# Patient Record
Sex: Female | Born: 1960 | Race: White | Hispanic: No | Marital: Married | Smoking: Never smoker
Health system: Southern US, Community
[De-identification: ages and names within clinical notes are randomized; demographics above are authoritative.]

## PROBLEM LIST (undated history)

## (undated) DIAGNOSIS — Z9889 Other specified postprocedural states: Secondary | ICD-10-CM

## (undated) DIAGNOSIS — T7840XA Allergy, unspecified, initial encounter: Secondary | ICD-10-CM

## (undated) DIAGNOSIS — F32A Depression, unspecified: Secondary | ICD-10-CM

## (undated) DIAGNOSIS — M1990 Unspecified osteoarthritis, unspecified site: Secondary | ICD-10-CM

## (undated) DIAGNOSIS — K219 Gastro-esophageal reflux disease without esophagitis: Secondary | ICD-10-CM

## (undated) DIAGNOSIS — Z8489 Family history of other specified conditions: Secondary | ICD-10-CM

## (undated) DIAGNOSIS — F419 Anxiety disorder, unspecified: Secondary | ICD-10-CM

## (undated) DIAGNOSIS — E7800 Pure hypercholesterolemia, unspecified: Secondary | ICD-10-CM

## (undated) HISTORY — PX: OOPHORECTOMY: SHX86

## (undated) HISTORY — PX: COLONOSCOPY: SHX174

## (undated) HISTORY — PX: CYSTECTOMY: SUR359

## (undated) HISTORY — PX: ESOPHAGOGASTRODUODENOSCOPY (EGD) WITH PROPOFOL: SHX5813

---

## 1999-04-28 ENCOUNTER — Encounter: Payer: Self-pay | Admitting: Family Medicine

## 1999-04-28 ENCOUNTER — Encounter: Admission: RE | Admit: 1999-04-28 | Discharge: 1999-04-28 | Payer: Self-pay | Admitting: Family Medicine

## 2000-06-09 ENCOUNTER — Encounter: Admission: RE | Admit: 2000-06-09 | Discharge: 2000-06-09 | Payer: Self-pay | Admitting: Family Medicine

## 2000-06-09 ENCOUNTER — Encounter: Payer: Self-pay | Admitting: Family Medicine

## 2001-06-22 ENCOUNTER — Encounter: Payer: Self-pay | Admitting: Family Medicine

## 2001-06-22 ENCOUNTER — Encounter: Admission: RE | Admit: 2001-06-22 | Discharge: 2001-06-22 | Payer: Self-pay | Admitting: Family Medicine

## 2001-07-15 ENCOUNTER — Encounter: Payer: Self-pay | Admitting: Family Medicine

## 2001-07-15 ENCOUNTER — Encounter: Admission: RE | Admit: 2001-07-15 | Discharge: 2001-07-15 | Payer: Self-pay | Admitting: Family Medicine

## 2001-11-14 ENCOUNTER — Encounter (INDEPENDENT_AMBULATORY_CARE_PROVIDER_SITE_OTHER): Payer: Self-pay | Admitting: Specialist

## 2001-11-14 ENCOUNTER — Ambulatory Visit (HOSPITAL_COMMUNITY): Admission: RE | Admit: 2001-11-14 | Discharge: 2001-11-14 | Payer: Self-pay | Admitting: Obstetrics and Gynecology

## 2002-07-06 ENCOUNTER — Other Ambulatory Visit: Admission: RE | Admit: 2002-07-06 | Discharge: 2002-07-06 | Payer: Self-pay | Admitting: Obstetrics and Gynecology

## 2002-07-18 ENCOUNTER — Encounter: Payer: Self-pay | Admitting: Obstetrics and Gynecology

## 2002-07-18 ENCOUNTER — Ambulatory Visit (HOSPITAL_COMMUNITY): Admission: RE | Admit: 2002-07-18 | Discharge: 2002-07-18 | Payer: Self-pay | Admitting: Obstetrics and Gynecology

## 2003-07-11 ENCOUNTER — Other Ambulatory Visit: Admission: RE | Admit: 2003-07-11 | Discharge: 2003-07-11 | Payer: Self-pay | Admitting: Obstetrics and Gynecology

## 2003-07-25 ENCOUNTER — Ambulatory Visit (HOSPITAL_COMMUNITY): Admission: RE | Admit: 2003-07-25 | Discharge: 2003-07-25 | Payer: Self-pay | Admitting: Obstetrics and Gynecology

## 2004-08-25 ENCOUNTER — Other Ambulatory Visit: Admission: RE | Admit: 2004-08-25 | Discharge: 2004-08-25 | Payer: Self-pay | Admitting: Obstetrics and Gynecology

## 2004-09-09 ENCOUNTER — Ambulatory Visit (HOSPITAL_COMMUNITY): Admission: RE | Admit: 2004-09-09 | Discharge: 2004-09-09 | Payer: Self-pay | Admitting: Obstetrics and Gynecology

## 2005-09-11 ENCOUNTER — Other Ambulatory Visit: Admission: RE | Admit: 2005-09-11 | Discharge: 2005-09-11 | Payer: Self-pay | Admitting: Family Medicine

## 2005-09-11 ENCOUNTER — Ambulatory Visit: Payer: Self-pay | Admitting: Family Medicine

## 2005-09-11 ENCOUNTER — Encounter: Payer: Self-pay | Admitting: Family Medicine

## 2005-09-16 ENCOUNTER — Ambulatory Visit (HOSPITAL_COMMUNITY): Admission: RE | Admit: 2005-09-16 | Discharge: 2005-09-16 | Payer: Self-pay | Admitting: Family Medicine

## 2006-04-05 ENCOUNTER — Ambulatory Visit: Payer: Self-pay | Admitting: Family Medicine

## 2006-04-07 ENCOUNTER — Encounter: Payer: Self-pay | Admitting: Family Medicine

## 2006-04-07 ENCOUNTER — Telehealth (INDEPENDENT_AMBULATORY_CARE_PROVIDER_SITE_OTHER): Payer: Self-pay | Admitting: *Deleted

## 2006-04-15 ENCOUNTER — Ambulatory Visit: Payer: Self-pay | Admitting: Family Medicine

## 2006-04-15 DIAGNOSIS — J157 Pneumonia due to Mycoplasma pneumoniae: Secondary | ICD-10-CM

## 2006-05-13 ENCOUNTER — Ambulatory Visit: Payer: Self-pay | Admitting: Family Medicine

## 2006-05-13 DIAGNOSIS — J329 Chronic sinusitis, unspecified: Secondary | ICD-10-CM | POA: Insufficient documentation

## 2006-05-14 ENCOUNTER — Encounter: Payer: Self-pay | Admitting: Family Medicine

## 2006-05-17 LAB — CONVERTED CEMR LAB
Basophils Relative: 1 % (ref 0–1)
HCT: 44.4 % (ref 36.0–46.0)
Lymphocytes Relative: 33 % (ref 12–46)
Platelets: 296 10*3/uL (ref 150–400)
RDW: 13 % (ref 11.5–14.0)
WBC: 5 10*3/uL (ref 4.0–10.5)

## 2006-05-18 ENCOUNTER — Encounter: Payer: Self-pay | Admitting: Family Medicine

## 2006-10-05 ENCOUNTER — Encounter: Payer: Self-pay | Admitting: Family Medicine

## 2006-10-05 ENCOUNTER — Encounter: Admission: RE | Admit: 2006-10-05 | Discharge: 2006-10-05 | Payer: Self-pay | Admitting: Family Medicine

## 2006-10-05 ENCOUNTER — Other Ambulatory Visit: Admission: RE | Admit: 2006-10-05 | Discharge: 2006-10-05 | Payer: Self-pay | Admitting: Family Medicine

## 2006-10-05 ENCOUNTER — Ambulatory Visit: Payer: Self-pay | Admitting: Family Medicine

## 2006-10-05 DIAGNOSIS — E781 Pure hyperglyceridemia: Secondary | ICD-10-CM | POA: Insufficient documentation

## 2006-10-05 DIAGNOSIS — F411 Generalized anxiety disorder: Secondary | ICD-10-CM | POA: Insufficient documentation

## 2006-10-05 DIAGNOSIS — N951 Menopausal and female climacteric states: Secondary | ICD-10-CM

## 2006-10-06 ENCOUNTER — Telehealth (INDEPENDENT_AMBULATORY_CARE_PROVIDER_SITE_OTHER): Payer: Self-pay | Admitting: *Deleted

## 2006-10-12 ENCOUNTER — Telehealth (INDEPENDENT_AMBULATORY_CARE_PROVIDER_SITE_OTHER): Payer: Self-pay | Admitting: *Deleted

## 2006-10-12 ENCOUNTER — Encounter: Admission: RE | Admit: 2006-10-12 | Discharge: 2006-10-12 | Payer: Self-pay | Admitting: Family Medicine

## 2006-10-12 ENCOUNTER — Encounter: Payer: Self-pay | Admitting: Family Medicine

## 2007-02-22 ENCOUNTER — Telehealth: Payer: Self-pay | Admitting: Family Medicine

## 2007-08-08 ENCOUNTER — Telehealth: Payer: Self-pay | Admitting: Family Medicine

## 2008-01-20 ENCOUNTER — Encounter: Admission: RE | Admit: 2008-01-20 | Discharge: 2008-01-20 | Payer: Self-pay | Admitting: Family Medicine

## 2008-04-19 ENCOUNTER — Other Ambulatory Visit: Admission: RE | Admit: 2008-04-19 | Discharge: 2008-04-19 | Payer: Self-pay | Admitting: Family Medicine

## 2009-01-28 ENCOUNTER — Encounter: Admission: RE | Admit: 2009-01-28 | Discharge: 2009-01-28 | Payer: Self-pay | Admitting: Family Medicine

## 2010-02-25 ENCOUNTER — Ambulatory Visit (HOSPITAL_COMMUNITY)
Admission: RE | Admit: 2010-02-25 | Discharge: 2010-02-25 | Payer: Self-pay | Source: Home / Self Care | Admitting: Family Medicine

## 2010-04-27 ENCOUNTER — Encounter: Payer: Self-pay | Admitting: Family Medicine

## 2010-08-22 NOTE — Op Note (Signed)
NAME:  Alexandria Middleton, HOFSTRA A                           ACCOUNT NO.:  [id number]   MEDICAL RECORD NO.:  [id number]                   PATIENT TYPE:  AMB   LOCATION:  SDC                                  FACILITY:  [facility]   PHYSICIAN:  Cynthia P. Romine, M.D.             DATE OF BIRTH:  1960-11-02   DATE OF PROCEDURE:  11/14/2001  DATE OF DISCHARGE:                                 OPERATIVE REPORT   PREOPERATIVE DIAGNOSIS:  Persistent left ovarian cyst.   POSTOPERATIVE DIAGNOSIS:  Probable endometrioma, path pending.   PROCEDURE:  Laparoscopic left salpingo-oophorectomy.   SURGEON:  Cynthia P. Romine, M.D.   ASSISTANT:  Laqueta Linden, M.D.   ANESTHESIA:  General endotracheal.   ESTIMATED BLOOD LOSS:  Minimal.   COMPLICATIONS:  None.   PROCEDURE:  The patient was taken to the operating room and, after the  induction of adequate general endotracheal anesthesia, was placed in the  dorsal lithotomy position and prepped and draped in the usual fashion.  An  acorn uterine manipulator and the bladder was drained with a Foley.  A  subumbilical incision was made and the Veress needle was inserted into the  peritoneal space.  Proper placement was tested by noting free flow of saline  through the Veress needle with a negative aspirate and then by noting the  response of a drop of saline placed at the hub of the Veress needle to  negative pressure as the abdominal wall was elevated.  Pneumoperitoneum was  created with 3 liters of CO2.  A disposable 10 mm trocar was then inserted  into the peritoneal space and proper placement noted with the laparoscope.  Two 5 mm trocars were inserted suprapubically on the right and left under  direct visualization.  The pelvis was inspected.  The uterus was of normal  size, shape, and contour.  The right ovary was freely mobile and had an  ovulatory site present, but no endometriosis noted.  On the left side, the  ovary was adherent to the posterior cul-de-sac and  the posterior leaf of the  broad ligament.  Upon trying to free up the ovary, it exuded a moderate  amount of chocolate fluid consistent with an endometrioma.  The ovary was  dissected off the peritoneum of the cul-de-sac and the posterior leaf of the  broad ligament.  Tripolar cautery was used to separate the tube and utero-  ovarian ligament off the uterus.  When the ovary was freed up in this  fashion, two 0 chromic Endoloops were used to tie off the infundibulopelvic  ligament and the specimen was freed up with scissors.  The specimen was then  morcellated and removed through the abdominal trocar sleeve.  The pelvis was  irrigated.  There was a scant amount of bleeding on the raw peritoneal  surfaces where the dissection had been carried out and this was controlled  with cautery  using the tripolar.  The appendix was visualized and was  normal.  The upper abdomen was inspected and also felt to be normal.  Photographic documentation was taken and the procedure was terminated.  The  instruments were removed from the abdomen, pneumoperitoneum was allowed to  escape, incisions were closed subcuticularly  with 3-0 Vicryl Rapide and they were injected with 0.5% Marcaine with  epinephrine.  Steri-Strips were applied.  The instruments were removed from  the vagina and the procedure was terminated.  The patient tolerated it well  and went in satisfactory condition to postanesthesia recovery.                                               Cynthia P. Romine, M.D.    CPR/MEDQ  D:  11/14/2001  T:  11/16/2001  Job:  [id number]

## 2011-02-05 ENCOUNTER — Other Ambulatory Visit (HOSPITAL_COMMUNITY): Payer: Self-pay | Admitting: Family Medicine

## 2011-02-05 DIAGNOSIS — Z1231 Encounter for screening mammogram for malignant neoplasm of breast: Secondary | ICD-10-CM

## 2011-03-04 ENCOUNTER — Ambulatory Visit (HOSPITAL_COMMUNITY)
Admission: RE | Admit: 2011-03-04 | Discharge: 2011-03-04 | Disposition: A | Discharge status: Home / Self Care | Payer: BC Managed Care – PPO | Source: Ambulatory Visit | Attending: Family Medicine | Admitting: Family Medicine

## 2011-03-04 DIAGNOSIS — Z1231 Encounter for screening mammogram for malignant neoplasm of breast: Secondary | ICD-10-CM | POA: Insufficient documentation

## 2012-01-28 ENCOUNTER — Other Ambulatory Visit (HOSPITAL_COMMUNITY): Payer: Self-pay | Admitting: Family Medicine

## 2012-01-28 DIAGNOSIS — Z1231 Encounter for screening mammogram for malignant neoplasm of breast: Secondary | ICD-10-CM

## 2012-03-07 ENCOUNTER — Ambulatory Visit (HOSPITAL_COMMUNITY)
Admission: RE | Admit: 2012-03-07 | Discharge: 2012-03-07 | Disposition: A | Discharge status: Home / Self Care | Payer: PRIVATE HEALTH INSURANCE | Source: Ambulatory Visit | Attending: Family Medicine | Admitting: Family Medicine

## 2012-03-07 DIAGNOSIS — Z1231 Encounter for screening mammogram for malignant neoplasm of breast: Secondary | ICD-10-CM | POA: Insufficient documentation

## 2012-07-05 ENCOUNTER — Other Ambulatory Visit: Payer: Self-pay | Admitting: Family Medicine

## 2012-07-05 ENCOUNTER — Other Ambulatory Visit (HOSPITAL_COMMUNITY)
Admission: RE | Admit: 2012-07-05 | Discharge: 2012-07-05 | Disposition: A | Discharge status: Home / Self Care | Payer: BC Managed Care – PPO | Source: Ambulatory Visit | Attending: Family Medicine | Admitting: Family Medicine

## 2012-07-05 DIAGNOSIS — Z0000 Encounter for general adult medical examination without abnormal findings: Secondary | ICD-10-CM | POA: Insufficient documentation

## 2019-06-23 ENCOUNTER — Other Ambulatory Visit: Payer: Self-pay | Admitting: Obstetrics and Gynecology

## 2019-06-23 DIAGNOSIS — Z1231 Encounter for screening mammogram for malignant neoplasm of breast: Secondary | ICD-10-CM

## 2019-06-30 ENCOUNTER — Ambulatory Visit: Payer: PRIVATE HEALTH INSURANCE | Attending: Internal Medicine

## 2019-06-30 DIAGNOSIS — Z23 Encounter for immunization: Secondary | ICD-10-CM

## 2019-06-30 NOTE — Progress Notes (Signed)
   Covid-19 Vaccination Clinic  Name:  VASTI YAGI    MRN: [id number] DOB: [birth year]  06/30/2019  Ms. Simonet was observed post Covid-19 immunization for 15 minutes without incident. She was provided with Vaccine Information Sheet and instruction to access the V-Safe system.   Ms. Wehling was instructed to call 911 with any severe reactions post vaccine: Marland Kitchen Difficulty breathing  . Swelling of face and throat  . A fast heartbeat  . A bad rash all over body  . Dizziness and weakness   Immunizations Administered    Name Date Dose VIS Date Route   Pfizer COVID-19 Vaccine 06/30/2019  9:11 AM 0.3 mL 03/17/2019 Intramuscular   Manufacturer: [employer], [employer]   Lot: [id number]   NDC: [address]-7

## 2019-07-18 ENCOUNTER — Ambulatory Visit: Payer: PRIVATE HEALTH INSURANCE

## 2019-07-25 ENCOUNTER — Ambulatory Visit: Payer: PRIVATE HEALTH INSURANCE | Attending: Internal Medicine

## 2019-07-25 DIAGNOSIS — Z23 Encounter for immunization: Secondary | ICD-10-CM

## 2019-07-25 NOTE — Progress Notes (Signed)
   Covid-19 Vaccination Clinic  Name:  Alexandria Middleton    MRN: [id number] DOB: 06-17-1960  07/25/2019  [name] was observed post Covid-19 immunization for 15 minutes without incident. She was provided with Vaccine Information Sheet and instruction to access the V-Safe system.   [name] was instructed to call 911 with any severe reactions post vaccine: Marland Kitchen Difficulty breathing  . Swelling of face and throat  . A fast heartbeat  . A bad rash all over body  . Dizziness and weakness   Immunizations Administered    Name Date Dose VIS Date Route   Pfizer COVID-19 Vaccine 07/25/2019  8:39 AM 0.3 mL 05/31/2018 Intramuscular   Manufacturer: [employer], [employer]   Lot: [id number]   NDC: [address]-9

## 2019-09-06 ENCOUNTER — Ambulatory Visit
Admission: RE | Admit: 2019-09-06 | Discharge: 2019-09-06 | Disposition: A | Discharge status: Home / Self Care | Payer: 59 | Source: Ambulatory Visit | Attending: Obstetrics and Gynecology | Admitting: Obstetrics and Gynecology

## 2019-09-06 ENCOUNTER — Other Ambulatory Visit: Payer: Self-pay

## 2019-09-06 DIAGNOSIS — Z1231 Encounter for screening mammogram for malignant neoplasm of breast: Secondary | ICD-10-CM

## 2020-10-23 ENCOUNTER — Other Ambulatory Visit: Payer: Self-pay | Admitting: Obstetrics and Gynecology

## 2021-04-01 ENCOUNTER — Other Ambulatory Visit: Payer: Self-pay | Admitting: Internal Medicine

## 2021-04-01 DIAGNOSIS — Z1231 Encounter for screening mammogram for malignant neoplasm of breast: Secondary | ICD-10-CM

## 2021-05-09 ENCOUNTER — Ambulatory Visit (INDEPENDENT_AMBULATORY_CARE_PROVIDER_SITE_OTHER): Payer: 59

## 2021-05-09 ENCOUNTER — Encounter (HOSPITAL_COMMUNITY): Payer: Self-pay

## 2021-05-09 ENCOUNTER — Other Ambulatory Visit: Payer: Self-pay

## 2021-05-09 ENCOUNTER — Ambulatory Visit (HOSPITAL_COMMUNITY)
Admission: EM | Admit: 2021-05-09 | Discharge: 2021-05-09 | Disposition: A | Discharge status: Home / Self Care | Payer: 59 | Attending: Family Medicine | Admitting: Family Medicine

## 2021-05-09 DIAGNOSIS — R052 Subacute cough: Secondary | ICD-10-CM

## 2021-05-09 DIAGNOSIS — R059 Cough, unspecified: Secondary | ICD-10-CM

## 2021-05-09 DIAGNOSIS — J209 Acute bronchitis, unspecified: Secondary | ICD-10-CM | POA: Diagnosis not present

## 2021-05-09 MED ORDER — PREDNISONE 20 MG PO TABS: 40.0000 mg | ORAL_TABLET | Freq: Every day | ORAL | 0 refills | Status: AC

## 2021-05-09 MED ORDER — AZITHROMYCIN 250 MG PO TABS: 250.0000 mg | ORAL_TABLET | Freq: Every day | ORAL | 0 refills | Status: DC

## 2021-05-09 NOTE — Discharge Instructions (Signed)
You were seen today for upper respiratory symptoms.  You chest xray did not show any signs of pneumonia.  However, given your symptoms and your exam I have sent out a 5 day antibiotic for you today, along with 3 days of oral steroids.  You may take your other medications with this a well for relief.  If you continue to worsen or feel poorly, then please return for re-evaluation.

## 2021-05-09 NOTE — ED Provider Notes (Signed)
[facility]    CSN: [id number] Arrival date & time: 05/09/21  [id number]      History   Chief Complaint Chief Complaint  Patient presents with   URI    HPI Alexandria Middleton is a 61 y.o. female.   Patient is here for uri symptoms.  For the last 4 days having a lot of congestion, drainage, low grade fever, lots of chest congestion.  Deep cough.  [facility], flu and covid negative.  Recommended she be seen for possible chest xray based on her exam.  She has been taking mucinex, tessalon, sudafed, tylenol.  Last night she could hear a "crunchy" sound in her chest.  No energy.  Decreased appetite, no n/v per se.  She does feel sob with activity.   History reviewed. No pertinent past medical history.  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      Home Medications    Prior to Admission medications   Medication Sig Start Date End Date Taking? Authorizing Provider  Cholecalciferol (VITAMIN D3) 10 MCG (400 UNIT) tablet Take 400 Units by mouth daily.   Yes [provider]  Sertraline HCl 200 MG CAPS Take by mouth.   Yes [provider]    Family History Family History  Family history unknown: Yes    Social History Social History   Tobacco Use   Smoking status: Never   Smokeless tobacco: Never     Allergies   Codeine   Review of Systems Review of Systems  Constitutional:  Positive for activity change and appetite change. Negative for chills and fever.  HENT:  Positive for congestion, rhinorrhea and sore throat. Negative for sinus pain.   Respiratory:  Positive for cough, shortness of breath and wheezing.    Cardiovascular: Negative.   Gastrointestinal: Negative.     Physical Exam Triage Vital Signs ED Triage Vitals  Enc Vitals Group     BP 05/09/21 1013 116/79     Pulse Rate 05/09/21 1013 85     Resp 05/09/21 1013 17     Temp 05/09/21 1013 98 F (36.7 C)     Temp Source 05/09/21 1013 Oral     SpO2 05/09/21 1013 95 %     Weight --      Height --      Head Circumference --      Peak Flow --      Pain Score 05/09/21 1019 2     Pain Loc --      Pain Edu? --      Excl. in [place]? --    No data found.  Updated Vital Signs BP 116/79 (BP Location: Right Arm)    Pulse 85    Temp 98 F (36.7 C) (Oral)    Resp 17    SpO2 95%   Visual Acuity Right Eye Distance:   Left Eye Distance:   Bilateral Distance:    Right Eye Near:   Left Eye Near:    Bilateral Near:     Physical Exam Constitutional:      Appearance: Normal appearance.  HENT:     Head: Normocephalic  and atraumatic.     Right Ear: Tympanic membrane normal.     Left Ear: Tympanic membrane normal.     Nose: Congestion present.  Eyes:     Pupils: Pupils are equal, round, and reactive to light.  Cardiovascular:     Rate and Rhythm: Normal rate and regular rhythm.  Pulmonary:     Effort: Pulmonary effort is normal.     Breath sounds: Rhonchi present.  Musculoskeletal:     Cervical back: Normal range of motion and neck supple. No tenderness.  Neurological:     Mental Status: She is alert.     UC Treatments / Results  Labs (all labs ordered are listed, but only abnormal results are displayed) Labs Reviewed - No data to display  EKG   Radiology DG Chest 2 View  Result Date: 05/09/2021 CLINICAL DATA:  Cough EXAM: CHEST - 2 VIEW COMPARISON:  None. FINDINGS: Cardiac size is within normal limits. There are no signs of pulmonary edema or focal pulmonary consolidation. There is no pleural effusion or pneumothorax. There is large fixed hiatal hernia. IMPRESSION: There are no signs of pulmonary edema or focal pulmonary  consolidation. Large fixed hiatal hernia. Electronically Signed   By: Elmer Picker M.D.   On: 05/09/2021 10:49    Procedures Procedures (including critical care time)  Medications Ordered in UC Medications - No data to display  Initial Impression / Assessment and Plan / UC Course  I have reviewed the triage vital signs and the nursing notes.  Pertinent labs & imaging results that were available during my care of the patient were reviewed by me and considered in my medical decision making (see chart for details).    Patient seen today for uri symptoms.  She has a significant cough, worsening.  Xray was normal.  However, given her symtpoms and exam I have treated her with an antibiotic and prednisone.  She may continue otc meds as she has been taking them.  She should follow up if she is not improving as expected.   Final Clinical Impressions(s) / UC Diagnoses   Final diagnoses:  Acute bronchitis, unspecified organism  Subacute cough     Discharge Instructions      You were seen today for upper respiratory symptoms.  You chest xray did not show any signs of pneumonia.  However, given your symptoms and your exam I have sent out a 5 day antibiotic for you today, along with 3 days of oral steroids.  You may take your other medications with this a well for relief.  If you continue to worsen or feel poorly, then please return for re-evaluation.     ED Prescriptions     Medication Sig Dispense Auth. Provider   azithromycin (ZITHROMAX) 250 MG tablet Take 1 tablet (250 mg total) by mouth daily. Take first 2 tablets together, then 1 every day until finished. 6 tablet Laina Guerrieri, MD   predniSONE (DELTASONE) 20 MG tablet Take 2 tablets (40 mg total) by mouth daily for 3 days. 6 tablet Rondel Oh, MD      PDMP not reviewed this encounter.   Rondel Oh, MD 05/09/21 [phone]

## 2021-05-09 NOTE — ED Triage Notes (Signed)
Pt presents with chills, congestion, non productive cough that causes chest tightness, and fatigue X 3 days

## 2021-05-20 ENCOUNTER — Other Ambulatory Visit: Payer: Self-pay

## 2021-05-20 ENCOUNTER — Ambulatory Visit
Admission: RE | Admit: 2021-05-20 | Discharge: 2021-05-20 | Disposition: A | Discharge status: Home / Self Care | Payer: 59 | Source: Ambulatory Visit | Attending: Internal Medicine | Admitting: Internal Medicine

## 2021-05-20 DIAGNOSIS — Z1231 Encounter for screening mammogram for malignant neoplasm of breast: Secondary | ICD-10-CM

## 2022-04-17 ENCOUNTER — Ambulatory Visit
Admission: RE | Admit: 2022-04-17 | Discharge: 2022-04-17 | Disposition: A | Discharge status: Home / Self Care | Payer: 59 | Source: Ambulatory Visit | Attending: Internal Medicine | Admitting: Internal Medicine

## 2022-04-17 ENCOUNTER — Other Ambulatory Visit: Payer: Self-pay | Admitting: Internal Medicine

## 2022-04-17 DIAGNOSIS — M25561 Pain in right knee: Secondary | ICD-10-CM

## 2022-04-21 ENCOUNTER — Other Ambulatory Visit: Payer: Self-pay | Admitting: Internal Medicine

## 2022-04-21 ENCOUNTER — Encounter: Payer: Self-pay | Admitting: Internal Medicine

## 2022-04-21 DIAGNOSIS — Z1231 Encounter for screening mammogram for malignant neoplasm of breast: Secondary | ICD-10-CM

## 2022-04-22 ENCOUNTER — Other Ambulatory Visit: Payer: Self-pay | Admitting: Internal Medicine

## 2022-04-22 DIAGNOSIS — E785 Hyperlipidemia, unspecified: Secondary | ICD-10-CM

## 2022-05-22 ENCOUNTER — Ambulatory Visit
Admission: RE | Admit: 2022-05-22 | Discharge: 2022-05-22 | Disposition: A | Discharge status: Home / Self Care | Payer: No Typology Code available for payment source | Source: Ambulatory Visit | Attending: Internal Medicine | Admitting: Internal Medicine

## 2022-05-22 DIAGNOSIS — E785 Hyperlipidemia, unspecified: Secondary | ICD-10-CM

## 2022-06-09 ENCOUNTER — Ambulatory Visit
Admission: RE | Admit: 2022-06-09 | Discharge: 2022-06-09 | Disposition: A | Discharge status: Home / Self Care | Payer: 59 | Source: Ambulatory Visit | Attending: Internal Medicine | Admitting: Internal Medicine

## 2022-06-09 DIAGNOSIS — Z1231 Encounter for screening mammogram for malignant neoplasm of breast: Secondary | ICD-10-CM

## 2022-06-12 ENCOUNTER — Other Ambulatory Visit: Payer: Self-pay | Admitting: Internal Medicine

## 2022-06-12 DIAGNOSIS — R928 Other abnormal and inconclusive findings on diagnostic imaging of breast: Secondary | ICD-10-CM

## 2022-06-29 ENCOUNTER — Ambulatory Visit: Admission: RE | Admit: 2022-06-29 | Payer: 59 | Source: Ambulatory Visit

## 2022-06-29 ENCOUNTER — Other Ambulatory Visit: Payer: Self-pay | Admitting: Internal Medicine

## 2022-06-29 ENCOUNTER — Ambulatory Visit
Admission: RE | Admit: 2022-06-29 | Discharge: 2022-06-29 | Disposition: A | Discharge status: Home / Self Care | Payer: 59 | Source: Ambulatory Visit | Attending: Internal Medicine | Admitting: Internal Medicine

## 2022-06-29 DIAGNOSIS — R928 Other abnormal and inconclusive findings on diagnostic imaging of breast: Secondary | ICD-10-CM

## 2022-06-29 DIAGNOSIS — R921 Mammographic calcification found on diagnostic imaging of breast: Secondary | ICD-10-CM

## 2022-07-16 ENCOUNTER — Ambulatory Visit
Admission: RE | Admit: 2022-07-16 | Discharge: 2022-07-16 | Disposition: A | Discharge status: Home / Self Care | Payer: 59 | Source: Ambulatory Visit | Attending: Internal Medicine | Admitting: Internal Medicine

## 2022-07-16 DIAGNOSIS — R921 Mammographic calcification found on diagnostic imaging of breast: Secondary | ICD-10-CM

## 2022-07-16 DIAGNOSIS — R928 Other abnormal and inconclusive findings on diagnostic imaging of breast: Secondary | ICD-10-CM

## 2022-07-16 HISTORY — PX: BREAST BIOPSY: SHX20

## 2022-08-10 IMAGING — MG MM DIGITAL SCREENING BILAT W/ TOMO AND CAD
8 series · 8 of 24 positions shown · non-contrast
Comparison: Previous exam(s).

CLINICAL DATA: Screening.

EXAM:
DIGITAL SCREENING BILATERAL MAMMOGRAM WITH TOMOSYNTHESIS AND CAD
TECHNIQUE: Bilateral screening digital craniocaudal and mediolateral oblique
mammograms were obtained. Bilateral screening digital breast
tomosynthesis was performed. The images were evaluated with
computer-aided detection.

[L MLO synth-2D]
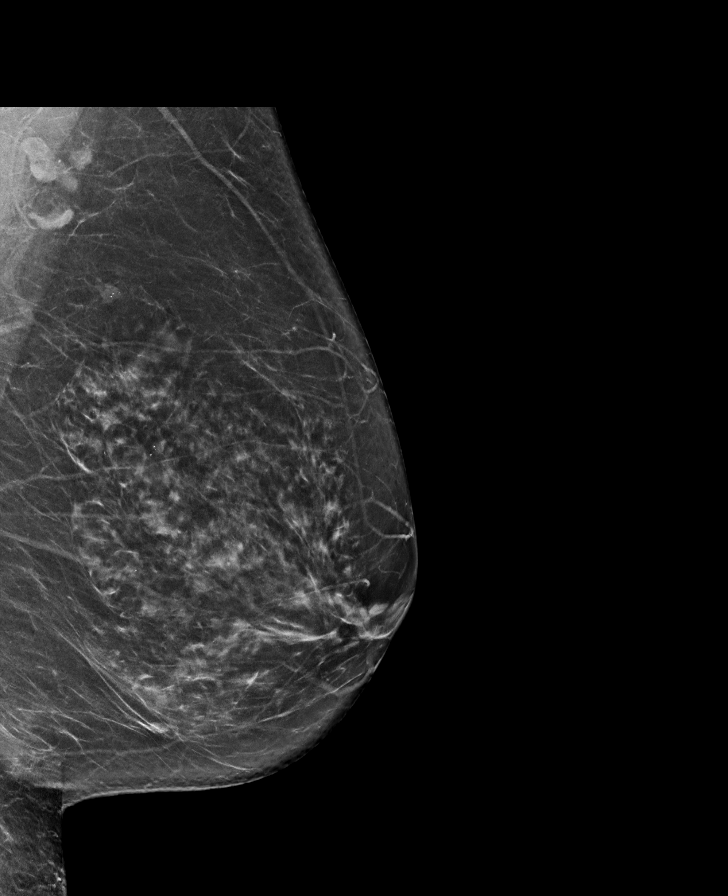

[R MLO synth-2D]
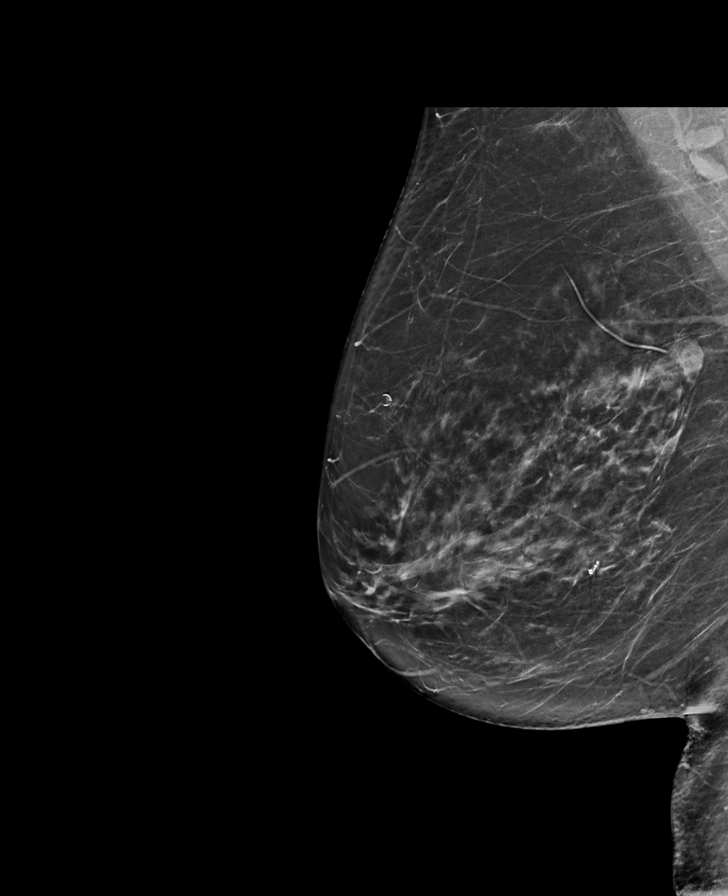

[R CC synth-2D]
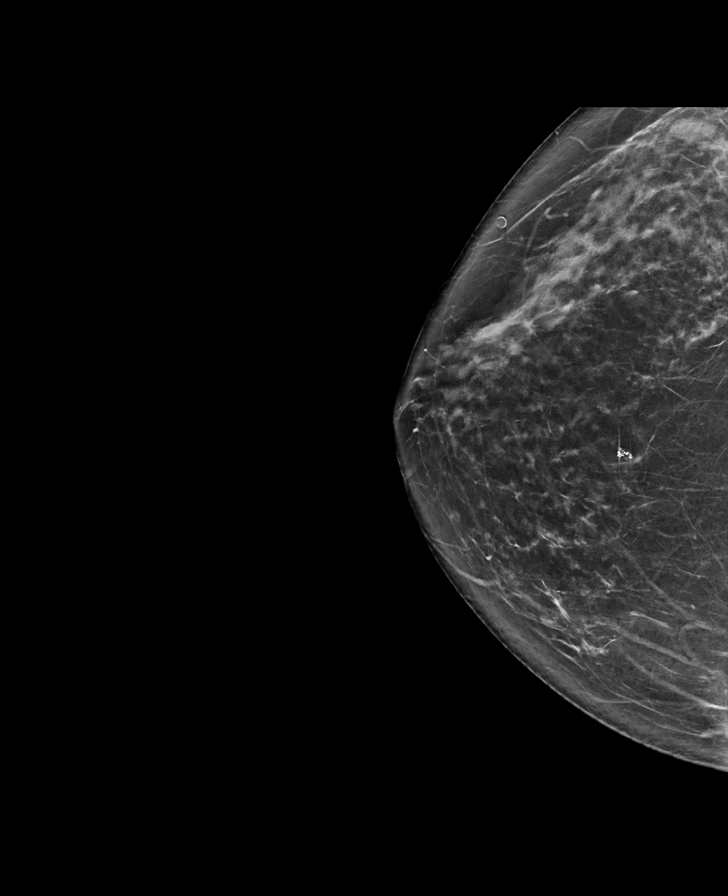

[L CC synth-2D]
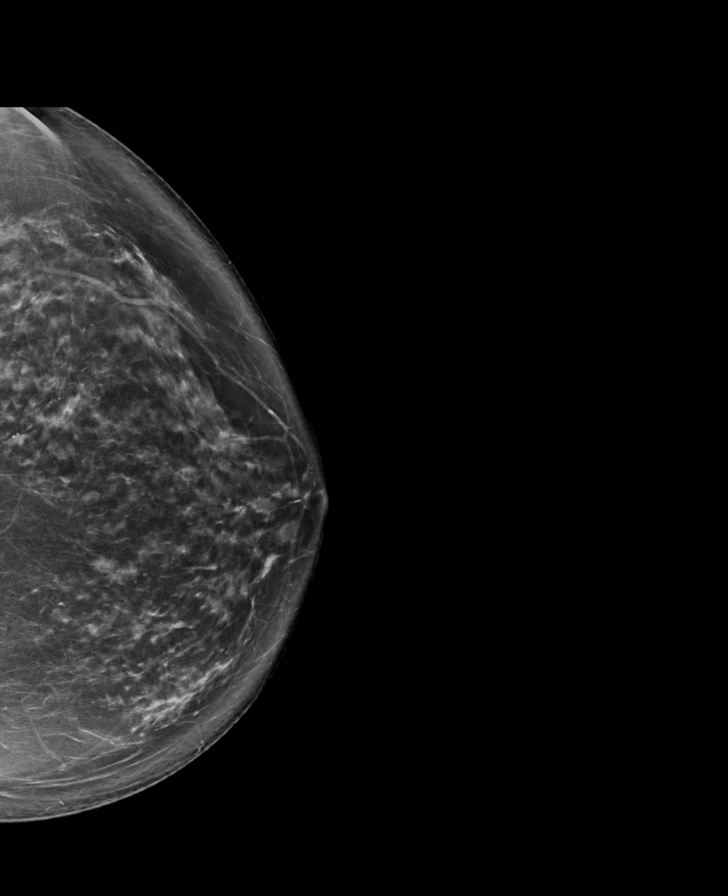

[L CC tomo · tomo slice 43/86.0]
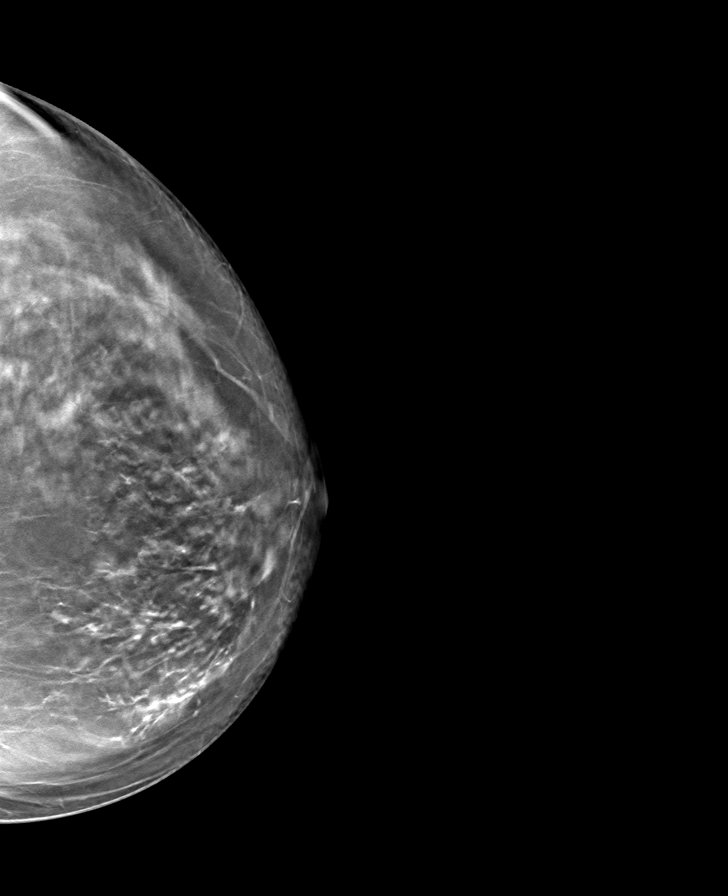

[R MLO tomo · tomo slice 41/80.0]
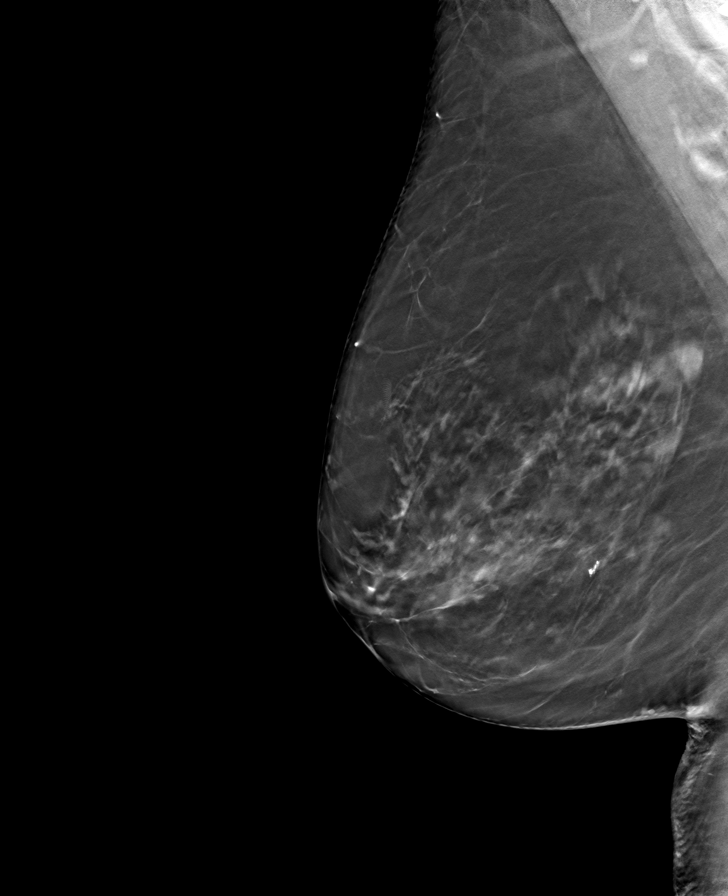

[L MLO tomo · tomo slice 43/84.0]
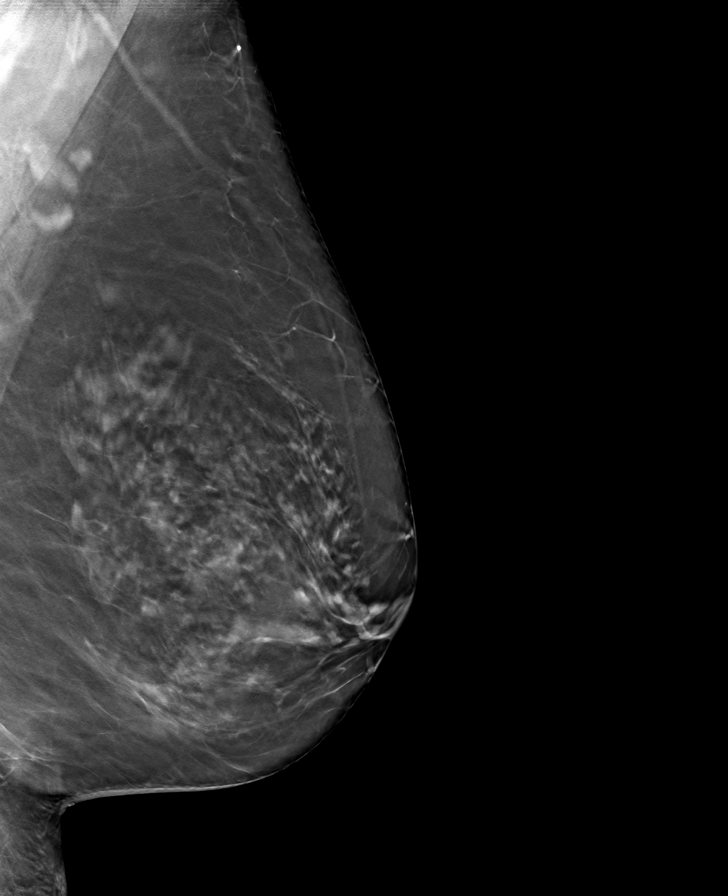

[R CC tomo · tomo slice 41/81.0]
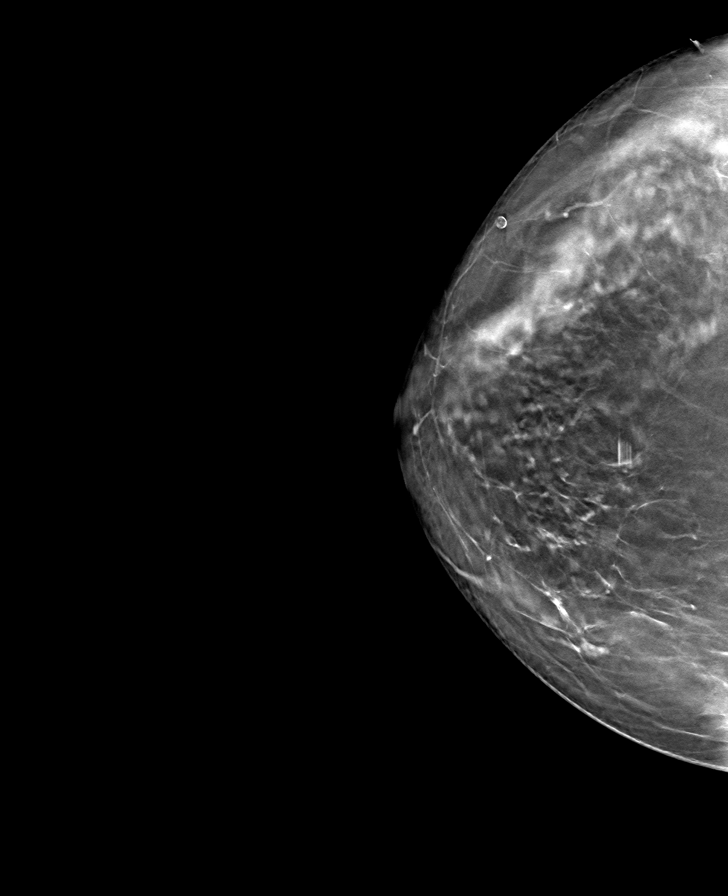

[8 of 24 positions shown; findings below may reference images not displayed]

ACR Breast Density Category c: The breast tissue is heterogeneously
dense, which may obscure small masses.
FINDINGS: There are no findings suspicious for malignancy.
IMPRESSION: No mammographic evidence of malignancy. A result letter of this
screening mammogram will be mailed directly to the patient.

RECOMMENDATION:
Screening mammogram in one year. (Code:[id number])

BI-RADS CATEGORY  1: Negative.

## 2023-01-18 ENCOUNTER — Other Ambulatory Visit: Payer: Self-pay | Admitting: Internal Medicine

## 2023-01-18 DIAGNOSIS — R921 Mammographic calcification found on diagnostic imaging of breast: Secondary | ICD-10-CM

## 2023-02-10 ENCOUNTER — Ambulatory Visit
Admission: RE | Admit: 2023-02-10 | Discharge: 2023-02-10 | Disposition: A | Discharge status: Home / Self Care | Payer: 59 | Source: Ambulatory Visit | Attending: Internal Medicine | Admitting: Internal Medicine

## 2023-02-10 ENCOUNTER — Ambulatory Visit: Payer: 59

## 2023-02-10 DIAGNOSIS — R921 Mammographic calcification found on diagnostic imaging of breast: Secondary | ICD-10-CM

## 2023-02-24 ENCOUNTER — Ambulatory Visit: Payer: Self-pay | Admitting: Orthopedic Surgery

## 2023-03-02 NOTE — Progress Notes (Signed)
COVID Vaccine Completed: yes  Date of COVID positive in last 90 days:  PCP - Hillard Danker, MD Cardiologist -   Chest x-ray -  EKG -  Stress Test -  ECHO -  Cardiac Cath -  Pacemaker/ICD device last checked: Spinal Cord Stimulator:  Bowel Prep -   Sleep Study -  CPAP -   Fasting Blood Sugar -  Checks Blood Sugar _____ times a day  Last dose of GLP1 agonist-  N/A GLP1 instructions:  Hold 7 days before surgery    Last dose of SGLT-2 inhibitors-  N/A SGLT-2 instructions:  Hold 3 days before surgery    Blood Thinner Instructions:  Time Aspirin Instructions: Last Dose:  Activity level:  Can go up a flight of stairs and perform activities of daily living without stopping and without symptoms of chest pain or shortness of breath.  Able to exercise without symptoms  Unable to go up a flight of stairs without symptoms of     Anesthesia review:   Patient denies shortness of breath, fever, cough and chest pain at PAT appointment  Patient verbalized understanding of instructions that were given to them at the PAT appointment. Patient was also instructed that they will need to review over the PAT instructions again at home before surgery.

## 2023-03-02 NOTE — Patient Instructions (Signed)
SURGICAL WAITING ROOM VISITATION  Patients having surgery or a procedure may have no more than 2 support people in the waiting area - these visitors may rotate.    Children under the age of 84 must have an adult with them who is not the patient.  Due to an increase in RSV and influenza rates and associated hospitalizations, children ages 33 and under may not visit patients in [facility] hospitals.  If the patient needs to stay at the hospital during part of their recovery, the visitor guidelines for inpatient rooms apply. Pre-op nurse will coordinate an appropriate time for 1 support person to accompany patient in pre-op.  This support person may not rotate.    Please refer to the [facility] website for the visitor guidelines for Inpatients (after your surgery is over and you are in a regular room).    Your procedure is scheduled on: 03/10/23   Report to [facility] Main Entrance    Report to admitting at 9:00 AM   Call this number if you have problems the morning of surgery [phone]   Do not eat food :After Midnight.   After Midnight you may have the following liquids until 8:30 AM DAY OF SURGERY  Water Non-Citrus Juices (without pulp, NO RED-Apple, White grape, White cranberry) Black Coffee (NO MILK/CREAM OR CREAMERS, sugar ok)  Clear Tea (NO MILK/CREAM OR CREAMERS, sugar ok) regular and decaf                             Plain Jell-O (NO RED)                                           Fruit ices (not with fruit pulp, NO RED)                                     Popsicles (NO RED)                                                               Sports drinks like Gatorade (NO RED)     The day of surgery:  Drink ONE (1) Pre-Surgery Clear Ensure at 8:30 AM the morning of surgery. Drink in one sitting. Do not sip.  This drink was given to you during your hospital  pre-op appointment visit. Nothing else to drink after completing the  Pre-Surgery Clear Ensure.           If you have questions, please contact your surgeon's office.   FOLLOW BOWEL PREP AND ANY ADDITIONAL PRE OP INSTRUCTIONS YOU RECEIVED FROM YOUR SURGEON'S OFFICE!!!     Oral Hygiene is also important to reduce your risk of infection.                                    Remember - BRUSH YOUR TEETH THE MORNING OF SURGERY WITH YOUR REGULAR TOOTHPASTE  DENTURES WILL BE REMOVED PRIOR TO SURGERY PLEASE DO NOT APPLY "Poly grip" OR  ADHESIVES!!!   Stop all vitamins and herbal supplements 7 days before surgery.   Take these medicines the morning of surgery with A SIP OF WATER: Tylenol, Omeprazole, Sertraline                               You may not have any metal on your body including hair pins, jewelry, and body piercing             Do not wear make-up, lotions, powders, perfumes, or deodorant  Do not wear nail polish including gel and S&S, artificial/acrylic nails, or any other type of covering on natural nails including finger and toenails. If you have artificial nails, gel coating, etc. that needs to be removed by a nail salon please have this removed prior to surgery or surgery may need to be canceled/ delayed if the surgeon/ anesthesia feels like they are unable to be safely monitored.   Do not shave  48 hours prior to surgery.    Do not bring valuables to the hospital. [facility] IS NOT             RESPONSIBLE   FOR VALUABLES.   Contacts, glasses, dentures or bridgework may not be worn into surgery.   Bring small overnight bag day of surgery.   DO NOT BRING YOUR HOME MEDICATIONS TO THE HOSPITAL. PHARMACY WILL DISPENSE MEDICATIONS LISTED ON YOUR MEDICATION LIST TO YOU DURING YOUR ADMISSION IN THE HOSPITAL!   Special Instructions: Bring a copy of your healthcare power of attorney and living will documents the day of surgery if you haven't scanned them before.              Please read over the following fact sheets you were given: IF YOU HAVE QUESTIONS ABOUT YOUR PRE-OP INSTRUCTIONS  PLEASE CALL [name]    If you received a COVID test during your pre-op visit  it is requested that you wear a mask when out in public, stay away from anyone that may not be feeling well and notify your surgeon if you develop symptoms. If you test positive for Covid or have been in contact with anyone that has tested positive in the last 10 days please notify you surgeon.      Pre-operative 5 CHG Bath Instructions   You can play a key role in reducing the risk of infection after surgery. Your skin needs to be as free of germs as possible. You can reduce the number of germs on your skin by washing with CHG (chlorhexidine gluconate) soap before surgery. CHG is an antiseptic soap that kills germs and continues to kill germs even after washing.   DO NOT use if you have an allergy to chlorhexidine/CHG or antibacterial soaps. If your skin becomes reddened or irritated, stop using the CHG and notify one of our RNs at [phone].   Please shower with the CHG soap starting 4 days before surgery using the following schedule:     Please keep in mind the following:  DO NOT shave, including legs and underarms, starting the day of your first shower.   You may shave your face at any point before/day of surgery.  Place clean sheets on your bed the day you start using CHG soap. Use a clean washcloth (not used since being washed) for each shower. DO NOT sleep with pets once you start using the CHG.   CHG Shower Instructions:  If you choose to  wash your hair and private area, wash first with your normal shampoo/soap.  After you use shampoo/soap, rinse your hair and body thoroughly to remove shampoo/soap residue.  Turn the water OFF and apply about 3 tablespoons (45 ml) of CHG soap to a CLEAN washcloth.  Apply CHG soap ONLY FROM YOUR NECK DOWN TO YOUR TOES (washing for 3-5 minutes)  DO NOT use CHG soap on face, private areas, open wounds, or sores.  Pay special attention to the area where  your surgery is being performed.  If you are having back surgery, having someone wash your back for you may be helpful. Wait 2 minutes after CHG soap is applied, then you may rinse off the CHG soap.  Pat dry with a clean towel  Put on clean clothes/pajamas   If you choose to wear lotion, please use ONLY the CHG-compatible lotions on the back of this paper.     Additional instructions for the day of surgery: DO NOT APPLY any lotions, deodorants, cologne, or perfumes.   Put on clean/comfortable clothes.  Brush your teeth.  Ask your nurse before applying any prescription medications to the skin.      CHG Compatible Lotions   Aveeno Moisturizing lotion  Cetaphil Moisturizing Cream  Cetaphil Moisturizing Lotion  Clairol Herbal Essence Moisturizing Lotion, Dry Skin  Clairol Herbal Essence Moisturizing Lotion, Extra Dry Skin  Clairol Herbal Essence Moisturizing Lotion, Normal Skin  Curel Age Defying Therapeutic Moisturizing Lotion with Alpha Hydroxy  Curel Extreme Care Body Lotion  Curel Soothing Hands Moisturizing Hand Lotion  Curel Therapeutic Moisturizing Cream, Fragrance-Free  Curel Therapeutic Moisturizing Lotion, Fragrance-Free  Curel Therapeutic Moisturizing Lotion, Original Formula  Eucerin Daily Replenishing Lotion  Eucerin Dry Skin Therapy Plus Alpha Hydroxy Crme  Eucerin Dry Skin Therapy Plus Alpha Hydroxy Lotion  Eucerin Original Crme  Eucerin Original Lotion  Eucerin Plus Crme Eucerin Plus Lotion  Eucerin TriLipid Replenishing Lotion  Keri Anti-Bacterial Hand Lotion  Keri Deep Conditioning Original Lotion Dry Skin Formula Softly Scented  Keri Deep Conditioning Original Lotion, Fragrance Free Sensitive Skin Formula  Keri Lotion Fast Absorbing Fragrance Free Sensitive Skin Formula  Keri Lotion Fast Absorbing Softly Scented Dry Skin Formula  Keri Original Lotion  Keri Skin Renewal Lotion Keri Silky Smooth Lotion  Keri Silky Smooth Sensitive Skin Lotion  Nivea Body  Creamy Conditioning Oil  Nivea Body Extra Enriched Lotion  Nivea Body Original Lotion  Nivea Body Sheer Moisturizing Lotion Nivea Crme  Nivea Skin Firming Lotion  NutraDerm 30 Skin Lotion  NutraDerm Skin Lotion  NutraDerm Therapeutic Skin Cream  NutraDerm Therapeutic Skin Lotion  ProShield Protective Hand Cream  Provon moisturizing lotion   Incentive Spirometer  An incentive spirometer is a tool that can help keep your lungs clear and active. This tool measures how well you are filling your lungs with each breath. Taking long deep breaths may help reverse or decrease the chance of developing breathing (pulmonary) problems (especially infection) following: A long period of time when you are unable to move or be active. BEFORE THE PROCEDURE  If the spirometer includes an indicator to show your best effort, your nurse or respiratory therapist will set it to a desired goal. If possible, sit up straight or lean slightly forward. Try not to slouch. Hold the incentive spirometer in an upright position. INSTRUCTIONS FOR USE  Sit on the edge of your bed if possible, or sit up as far as you can in bed or on a chair. Hold the incentive spirometer in  an upright position. Breathe out normally. Place the mouthpiece in your mouth and seal your lips tightly around it. Breathe in slowly and as deeply as possible, raising the piston or the ball toward the top of the column. Hold your breath for 3-5 seconds or for as long as possible. Allow the piston or ball to fall to the bottom of the column. Remove the mouthpiece from your mouth and breathe out normally. Rest for a few seconds and repeat Steps 1 through 7 at least 10 times every 1-2 hours when you are awake. Take your time and take a few normal breaths between deep breaths. The spirometer may include an indicator to show your best effort. Use the indicator as a goal to work toward during each repetition. After each set of 10 deep breaths, practice  coughing to be sure your lungs are clear. If you have an incision (the cut made at the time of surgery), support your incision when coughing by placing a pillow or rolled up towels firmly against it. Once you are able to get out of bed, walk around indoors and cough well. You may stop using the incentive spirometer when instructed by your caregiver.  RISKS AND COMPLICATIONS Take your time so you do not get dizzy or light-headed. If you are in pain, you may need to take or ask for pain medication before doing incentive spirometry. It is harder to take a deep breath if you are having pain. AFTER USE Rest and breathe slowly and easily. It can be helpful to keep track of a log of your progress. Your caregiver can provide you with a simple table to help with this. If you are using the spirometer at home, follow these instructions: SEEK MEDICAL CARE IF:  You are having difficultly using the spirometer. You have trouble using the spirometer as often as instructed. Your pain medication is not giving enough relief while using the spirometer. You develop fever of 100.5 F (38.1 C) or higher. SEEK IMMEDIATE MEDICAL CARE IF:  You cough up bloody sputum that had not been present before. You develop fever of 102 F (38.9 C) or greater. You develop worsening pain at or near the incision site. MAKE SURE YOU:  Understand these instructions. Will watch your condition. Will get help right away if you are not doing well or get worse. Document Released: 08/03/2006 Document Revised: 06/15/2011 Document Reviewed: 10/04/2006 [facility] Patient Information 2014 [place], [place].   ________________________________________________________________________

## 2023-03-03 ENCOUNTER — Encounter (HOSPITAL_COMMUNITY)
Admission: RE | Admit: 2023-03-03 | Discharge: 2023-03-03 | Disposition: A | Discharge status: Home / Self Care | Payer: 59 | Source: Ambulatory Visit | Attending: Specialist | Admitting: Specialist

## 2023-03-03 ENCOUNTER — Other Ambulatory Visit: Payer: Self-pay

## 2023-03-03 ENCOUNTER — Encounter (HOSPITAL_COMMUNITY): Payer: Self-pay

## 2023-03-03 VITALS — BP 112/63 | HR 65 | Temp 97.9°F | Resp 16 | Ht 64.0 in | Wt 171.0 lb

## 2023-03-03 DIAGNOSIS — Z01812 Encounter for preprocedural laboratory examination: Secondary | ICD-10-CM | POA: Diagnosis present

## 2023-03-03 DIAGNOSIS — I2510 Atherosclerotic heart disease of native coronary artery without angina pectoris: Secondary | ICD-10-CM

## 2023-03-03 DIAGNOSIS — Z01818 Encounter for other preprocedural examination: Secondary | ICD-10-CM

## 2023-03-03 HISTORY — DX: Other specified postprocedural states: Z98.890

## 2023-03-03 HISTORY — DX: Gastro-esophageal reflux disease without esophagitis: K21.9

## 2023-03-03 HISTORY — DX: Pure hypercholesterolemia, unspecified: E78.00

## 2023-03-03 HISTORY — DX: Allergy, unspecified, initial encounter: T78.40XA

## 2023-03-03 HISTORY — DX: Family history of other specified conditions: Z84.89

## 2023-03-03 HISTORY — DX: Depression, unspecified: F32.A

## 2023-03-03 HISTORY — DX: Unspecified osteoarthritis, unspecified site: M19.90

## 2023-03-03 HISTORY — DX: Anxiety disorder, unspecified: F41.9

## 2023-03-03 HISTORY — DX: Nausea with vomiting, unspecified: Z98.890

## 2023-03-03 LAB — CBC
HCT: 42.1 % (ref 36.0–46.0)
Hemoglobin: 13.7 g/dL (ref 12.0–15.0)
MCH: 28.8 pg (ref 26.0–34.0)
MCHC: 32.5 g/dL (ref 30.0–36.0)
MCV: 88.4 fL (ref 80.0–100.0)
Platelets: 207 10*3/uL (ref 150–400)
RBC: 4.76 MIL/uL (ref 3.87–5.11)
RDW: 13 % (ref 11.5–15.5)
WBC: 4.2 10*3/uL (ref 4.0–10.5)
nRBC: 0 % (ref 0.0–0.2)

## 2023-03-03 LAB — SURGICAL PCR SCREEN
MRSA, PCR: NEGATIVE
Staphylococcus aureus: NEGATIVE

## 2023-03-09 NOTE — Anesthesia Preprocedure Evaluation (Signed)
Anesthesia Evaluation  Patient identified by MRN, date of birth, ID band Patient awake    Reviewed: Allergy & Precautions, NPO status , Patient's Chart, lab work & pertinent test results  History of Anesthesia Complications (+) PONV and history of anesthetic complications  Airway Mallampati: II  TM Distance: >3 FB Neck ROM: Full    Dental no notable dental hx.    Pulmonary neg pulmonary ROS   Pulmonary exam normal        Cardiovascular negative cardio ROS  Rhythm:Regular Rate:Normal     Neuro/Psych   Anxiety Depression    negative neurological ROS     GI/Hepatic Neg liver ROS,GERD  Medicated,,  Endo/Other  negative endocrine ROS    Renal/GU negative Renal ROS  negative genitourinary   Musculoskeletal  (+) Arthritis , Osteoarthritis,    Abdominal Normal abdominal exam  (+)   Peds  Hematology Lab Results      Component                Value               Date                      WBC                      4.2                 03/03/2023                HGB                      13.7                03/03/2023                HCT                      42.1                03/03/2023                MCV                      88.4                03/03/2023                PLT                      207                 03/03/2023              Anesthesia Other Findings   Reproductive/Obstetrics                             Anesthesia Physical Anesthesia Plan  ASA: 2  Anesthesia Plan: MAC, Regional and Spinal   Post-op Pain Management: Regional block*   Induction: Intravenous  PONV Risk Score and Plan: 3 and Ondansetron, Dexamethasone, Propofol infusion, Midazolam and Treatment may vary due to age or medical condition  Airway Management Planned: Simple Face Mask and Nasal Cannula  Additional Equipment: None  Intra-op Plan:   Post-operative Plan:   Informed Consent: I have reviewed the patients  History and Physical, chart,  labs and discussed the procedure including the risks, benefits and alternatives for the proposed anesthesia with the patient or authorized representative who has indicated his/her understanding and acceptance.     Dental advisory given  Plan Discussed with: CRNA  Anesthesia Plan Comments:        Anesthesia Quick Evaluation

## 2023-03-10 ENCOUNTER — Ambulatory Visit (HOSPITAL_COMMUNITY): Payer: 59

## 2023-03-10 ENCOUNTER — Encounter (HOSPITAL_COMMUNITY)
Admission: RE | Disposition: A | Discharge status: Home / Self Care | Payer: Self-pay | Source: Home / Self Care | Attending: Specialist

## 2023-03-10 ENCOUNTER — Observation Stay (HOSPITAL_COMMUNITY)
Admission: RE | Admit: 2023-03-10 | Discharge: 2023-03-12 | Disposition: A | Discharge status: Home / Self Care | Payer: 59 | Attending: Specialist | Admitting: Specialist

## 2023-03-10 ENCOUNTER — Other Ambulatory Visit: Payer: Self-pay

## 2023-03-10 ENCOUNTER — Ambulatory Visit (HOSPITAL_COMMUNITY): Payer: Self-pay | Admitting: Anesthesiology

## 2023-03-10 ENCOUNTER — Encounter (HOSPITAL_COMMUNITY): Payer: Self-pay | Admitting: Specialist

## 2023-03-10 ENCOUNTER — Other Ambulatory Visit (HOSPITAL_COMMUNITY): Payer: Self-pay

## 2023-03-10 ENCOUNTER — Ambulatory Visit (HOSPITAL_BASED_OUTPATIENT_CLINIC_OR_DEPARTMENT_OTHER): Payer: 59 | Admitting: Anesthesiology

## 2023-03-10 DIAGNOSIS — M1711 Unilateral primary osteoarthritis, right knee: Secondary | ICD-10-CM

## 2023-03-10 DIAGNOSIS — M21161 Varus deformity, not elsewhere classified, right knee: Secondary | ICD-10-CM | POA: Insufficient documentation

## 2023-03-10 DIAGNOSIS — Z96651 Presence of right artificial knee joint: Principal | ICD-10-CM

## 2023-03-10 HISTORY — PX: TOTAL KNEE ARTHROPLASTY: SHX125

## 2023-03-10 SURGERY — ARTHROPLASTY, KNEE, TOTAL
Anesthesia: Monitor Anesthesia Care | Site: Knee | Laterality: Right

## 2023-03-10 MED ORDER — METOCLOPRAMIDE HCL 5 MG PO TABS: 5.0000 mg | ORAL_TABLET | Freq: Three times a day (TID) | ORAL | Status: DC | PRN

## 2023-03-10 MED ORDER — CEFAZOLIN SODIUM-DEXTROSE 2-4 GM/100ML-% IV SOLN
2.0000 g | Freq: Four times a day (QID) | INTRAVENOUS | Status: AC
  Administered 2023-03-10 (×2): 2 g via INTRAVENOUS
  Filled 2023-03-10 (×2): qty 100

## 2023-03-10 MED ORDER — ORAL CARE MOUTH RINSE: 15.0000 mL | Freq: Once | OROMUCOSAL | Status: AC

## 2023-03-10 MED ORDER — ONDANSETRON HCL 4 MG PO TABS: 4.0000 mg | ORAL_TABLET | Freq: Four times a day (QID) | ORAL | Status: DC | PRN

## 2023-03-10 MED ORDER — CEFAZOLIN SODIUM-DEXTROSE 2-4 GM/100ML-% IV SOLN
2.0000 g | INTRAVENOUS | Status: AC
  Administered 2023-03-10: 2 g via INTRAVENOUS
  Filled 2023-03-10: qty 100

## 2023-03-10 MED ORDER — ONDANSETRON HCL 4 MG/2ML IJ SOLN: 4.0000 mg | Freq: Four times a day (QID) | INTRAMUSCULAR | Status: DC | PRN

## 2023-03-10 MED ORDER — SODIUM CHLORIDE (PF) 0.9 % IJ SOLN
INTRAMUSCULAR | Status: AC
  Filled 2023-03-10: qty 20

## 2023-03-10 MED ORDER — OXYCODONE HCL 5 MG PO TABS
ORAL_TABLET | ORAL | Status: AC
  Administered 2023-03-10: 5 mg
  Filled 2023-03-10: qty 1

## 2023-03-10 MED ORDER — OXYCODONE HCL 5 MG PO TABS
5.0000 mg | ORAL_TABLET | ORAL | 0 refills | Status: DC | PRN
  Filled 2023-03-10: qty 40, 7d supply, fill #0

## 2023-03-10 MED ORDER — METHOCARBAMOL 500 MG PO TABS
500.0000 mg | ORAL_TABLET | Freq: Four times a day (QID) | ORAL | Status: DC | PRN
  Administered 2023-03-10 – 2023-03-11 (×4): 500 mg via ORAL
  Filled 2023-03-10 (×4): qty 1

## 2023-03-10 MED ORDER — SODIUM CHLORIDE 0.9% FLUSH
INTRAVENOUS | Status: DC | PRN
  Administered 2023-03-10: 40 mL via INTRAVENOUS

## 2023-03-10 MED ORDER — LACTATED RINGERS IV SOLN
INTRAVENOUS | Status: DC
Start: 2023-03-10 — End: 2023-03-10

## 2023-03-10 MED ORDER — AZITHROMYCIN 250 MG PO TABS: 250.0000 mg | ORAL_TABLET | Freq: Every day | ORAL | Status: DC

## 2023-03-10 MED ORDER — PHENYLEPHRINE HCL (PRESSORS) 10 MG/ML IV SOLN
INTRAVENOUS | Status: DC | PRN
  Administered 2023-03-10: 80 ug via INTRAVENOUS
  Administered 2023-03-10 (×2): 120 ug via INTRAVENOUS
  Administered 2023-03-10 (×2): 80 ug via INTRAVENOUS

## 2023-03-10 MED ORDER — SODIUM CHLORIDE 0.9 % IR SOLN
Status: DC | PRN
  Administered 2023-03-10: 3000 mL

## 2023-03-10 MED ORDER — SERTRALINE HCL 100 MG PO TABS
200.0000 mg | ORAL_TABLET | Freq: Every day | ORAL | Status: DC
  Administered 2023-03-10 – 2023-03-12 (×3): 200 mg via ORAL
  Filled 2023-03-10 (×3): qty 2

## 2023-03-10 MED ORDER — FENTANYL CITRATE (PF) 100 MCG/2ML IJ SOLN
INTRAMUSCULAR | Status: AC
  Filled 2023-03-10: qty 2

## 2023-03-10 MED ORDER — AMISULPRIDE (ANTIEMETIC) 5 MG/2ML IV SOLN: 10.0000 mg | Freq: Once | INTRAVENOUS | Status: DC | PRN

## 2023-03-10 MED ORDER — EPHEDRINE 5 MG/ML INJ
INTRAVENOUS | Status: AC
  Filled 2023-03-10: qty 5

## 2023-03-10 MED ORDER — METOCLOPRAMIDE HCL 5 MG/ML IJ SOLN
5.0000 mg | Freq: Three times a day (TID) | INTRAMUSCULAR | Status: DC | PRN
Start: 2023-03-10 — End: 2023-03-12

## 2023-03-10 MED ORDER — PANTOPRAZOLE SODIUM 40 MG PO TBEC
80.0000 mg | DELAYED_RELEASE_TABLET | Freq: Every day | ORAL | Status: DC
  Administered 2023-03-10 – 2023-03-12 (×3): 80 mg via ORAL
  Filled 2023-03-10 (×3): qty 2

## 2023-03-10 MED ORDER — VITAMIN B-12 100 MCG PO TABS
100.0000 ug | ORAL_TABLET | Freq: Every day | ORAL | Status: DC
  Administered 2023-03-10 – 2023-03-12 (×3): 100 ug via ORAL
  Filled 2023-03-10 (×3): qty 1

## 2023-03-10 MED ORDER — OXYCODONE HCL 5 MG PO TABS
5.0000 mg | ORAL_TABLET | ORAL | Status: DC | PRN
  Administered 2023-03-10 – 2023-03-11 (×2): 10 mg via ORAL
  Filled 2023-03-10 (×2): qty 2

## 2023-03-10 MED ORDER — PHENOL 1.4 % MT LIQD: 1.0000 | OROMUCOSAL | Status: DC | PRN

## 2023-03-10 MED ORDER — DEXAMETHASONE SODIUM PHOSPHATE 10 MG/ML IJ SOLN
INTRAMUSCULAR | Status: DC | PRN
  Administered 2023-03-10: 10 mg

## 2023-03-10 MED ORDER — FENTANYL CITRATE PF 50 MCG/ML IJ SOSY: 25.0000 ug | PREFILLED_SYRINGE | INTRAMUSCULAR | Status: DC | PRN

## 2023-03-10 MED ORDER — ACETAMINOPHEN 500 MG PO TABS
1000.0000 mg | ORAL_TABLET | Freq: Four times a day (QID) | ORAL | Status: AC
  Administered 2023-03-10 – 2023-03-11 (×4): 1000 mg via ORAL
  Filled 2023-03-10 (×4): qty 2

## 2023-03-10 MED ORDER — ONDANSETRON HCL 4 MG/2ML IJ SOLN
INTRAMUSCULAR | Status: DC | PRN
  Administered 2023-03-10: 4 mg via INTRAVENOUS

## 2023-03-10 MED ORDER — MAGNESIUM OXIDE -MG SUPPLEMENT 400 (240 MG) MG PO TABS
200.0000 mg | ORAL_TABLET | Freq: Every day | ORAL | Status: DC
  Administered 2023-03-10: 200 mg via ORAL
  Filled 2023-03-10 (×2): qty 1

## 2023-03-10 MED ORDER — MIDAZOLAM HCL 2 MG/2ML IJ SOLN
INTRAMUSCULAR | Status: AC
Start: 2023-03-10 — End: ?
  Filled 2023-03-10: qty 2

## 2023-03-10 MED ORDER — POLYETHYLENE GLYCOL 3350 17 GM/SCOOP PO POWD
17.0000 g | Freq: Every day | ORAL | 0 refills | Status: AC
  Filled 2023-03-10: qty 238, 14d supply, fill #0

## 2023-03-10 MED ORDER — LACTATED RINGERS IV SOLN: INTRAVENOUS | Status: DC

## 2023-03-10 MED ORDER — CHLORHEXIDINE GLUCONATE 0.12 % MT SOLN
15.0000 mL | Freq: Once | OROMUCOSAL | Status: AC
  Administered 2023-03-10: 15 mL via OROMUCOSAL

## 2023-03-10 MED ORDER — BUPIVACAINE IN DEXTROSE 0.75-8.25 % IT SOLN
INTRATHECAL | Status: DC | PRN
  Administered 2023-03-10: 1.6 mL via INTRATHECAL

## 2023-03-10 MED ORDER — DEXAMETHASONE SODIUM PHOSPHATE 10 MG/ML IJ SOLN
INTRAMUSCULAR | Status: DC | PRN
  Administered 2023-03-10: 10 mg via INTRAVENOUS

## 2023-03-10 MED ORDER — ASPIRIN 81 MG PO CHEW
81.0000 mg | CHEWABLE_TABLET | Freq: Two times a day (BID) | ORAL | Status: DC
  Administered 2023-03-11 – 2023-03-12 (×3): 81 mg via ORAL
  Filled 2023-03-10 (×3): qty 1

## 2023-03-10 MED ORDER — VITAMIN D 25 MCG (1000 UNIT) PO TABS
2000.0000 [IU] | ORAL_TABLET | Freq: Every day | ORAL | Status: DC
Start: 2023-03-10 — End: 2023-03-12
  Administered 2023-03-10 – 2023-03-12 (×3): 2000 [IU] via ORAL
  Filled 2023-03-10 (×6): qty 2

## 2023-03-10 MED ORDER — BUPIVACAINE LIPOSOME 1.3 % IJ SUSP
INTRAMUSCULAR | Status: AC
  Filled 2023-03-10: qty 20

## 2023-03-10 MED ORDER — BISACODYL 5 MG PO TBEC: 5.0000 mg | DELAYED_RELEASE_TABLET | Freq: Every day | ORAL | Status: DC | PRN

## 2023-03-10 MED ORDER — ASPIRIN 81 MG PO TBEC
81.0000 mg | DELAYED_RELEASE_TABLET | Freq: Two times a day (BID) | ORAL | 1 refills | Status: AC
  Filled 2023-03-10: qty 60, 30d supply, fill #0

## 2023-03-10 MED ORDER — STERILE WATER FOR IRRIGATION IR SOLN
Status: DC | PRN
  Administered 2023-03-10: 1000 mL

## 2023-03-10 MED ORDER — METHOCARBAMOL 500 MG PO TABS
500.0000 mg | ORAL_TABLET | Freq: Three times a day (TID) | ORAL | 1 refills | Status: AC | PRN
  Filled 2023-03-10: qty 30, 10d supply, fill #0

## 2023-03-10 MED ORDER — ACETAMINOPHEN 10 MG/ML IV SOLN
1000.0000 mg | INTRAVENOUS | Status: AC
  Administered 2023-03-10: 1000 mg via INTRAVENOUS
  Filled 2023-03-10: qty 100

## 2023-03-10 MED ORDER — POLYETHYLENE GLYCOL 3350 17 G PO PACK: 17.0000 g | PACK | Freq: Every day | ORAL | Status: DC | PRN

## 2023-03-10 MED ORDER — DEXAMETHASONE SODIUM PHOSPHATE 10 MG/ML IJ SOLN
INTRAMUSCULAR | Status: AC
  Filled 2023-03-10: qty 1

## 2023-03-10 MED ORDER — PROPOFOL 500 MG/50ML IV EMUL
INTRAVENOUS | Status: DC | PRN
  Administered 2023-03-10: 50 ug/kg/min via INTRAVENOUS

## 2023-03-10 MED ORDER — PROPOFOL 1000 MG/100ML IV EMUL
INTRAVENOUS | Status: AC
  Filled 2023-03-10: qty 100

## 2023-03-10 MED ORDER — DIPHENHYDRAMINE HCL 12.5 MG/5ML PO ELIX: 12.5000 mg | ORAL_SOLUTION | ORAL | Status: DC | PRN

## 2023-03-10 MED ORDER — FENTANYL CITRATE (PF) 100 MCG/2ML IJ SOLN
INTRAMUSCULAR | Status: DC | PRN
Start: 2023-03-10 — End: 2023-03-10
  Administered 2023-03-10 (×2): 50 ug via INTRAVENOUS

## 2023-03-10 MED ORDER — ONDANSETRON HCL 4 MG/2ML IJ SOLN
INTRAMUSCULAR | Status: AC
  Filled 2023-03-10: qty 2

## 2023-03-10 MED ORDER — BUPIVACAINE-EPINEPHRINE 0.25% -1:200000 IJ SOLN
INTRAMUSCULAR | Status: DC | PRN
  Administered 2023-03-10: 30 mL

## 2023-03-10 MED ORDER — ROPIVACAINE HCL 7.5 MG/ML IJ SOLN
INTRAMUSCULAR | Status: DC | PRN
  Administered 2023-03-10: 20 mL via PERINEURAL

## 2023-03-10 MED ORDER — ADULT MULTIVITAMIN W/MINERALS CH
1.0000 | ORAL_TABLET | Freq: Every day | ORAL | Status: DC
Start: 2023-03-10 — End: 2023-03-12
  Administered 2023-03-10 – 2023-03-12 (×3): 1 via ORAL
  Filled 2023-03-10 (×3): qty 1

## 2023-03-10 MED ORDER — METHOCARBAMOL 1000 MG/10ML IJ SOLN: 500.0000 mg | Freq: Four times a day (QID) | INTRAMUSCULAR | Status: DC | PRN

## 2023-03-10 MED ORDER — MAGNESIUM CITRATE PO SOLN: 1.0000 | Freq: Once | ORAL | Status: DC | PRN

## 2023-03-10 MED ORDER — ALUM & MAG HYDROXIDE-SIMETH 200-200-20 MG/5ML PO SUSP
30.0000 mL | ORAL | Status: DC | PRN
Start: 2023-03-10 — End: 2023-03-12

## 2023-03-10 MED ORDER — EPHEDRINE SULFATE (PRESSORS) 50 MG/ML IJ SOLN
INTRAMUSCULAR | Status: DC | PRN
  Administered 2023-03-10: 5 mg via INTRAVENOUS
  Administered 2023-03-10 (×2): 10 mg via INTRAVENOUS
  Administered 2023-03-10 (×3): 5 mg via INTRAVENOUS

## 2023-03-10 MED ORDER — PHENYLEPHRINE 80 MCG/ML (10ML) SYRINGE FOR IV PUSH (FOR BLOOD PRESSURE SUPPORT)
PREFILLED_SYRINGE | INTRAVENOUS | Status: AC
  Filled 2023-03-10: qty 10

## 2023-03-10 MED ORDER — HYDROMORPHONE HCL 1 MG/ML IJ SOLN
0.5000 mg | INTRAMUSCULAR | Status: DC | PRN
  Administered 2023-03-11: 1 mg via INTRAVENOUS
  Filled 2023-03-10: qty 1

## 2023-03-10 MED ORDER — BUPIVACAINE-EPINEPHRINE 0.25% -1:200000 IJ SOLN
INTRAMUSCULAR | Status: AC
  Filled 2023-03-10: qty 1

## 2023-03-10 MED ORDER — ACETAMINOPHEN 325 MG PO TABS: 325.0000 mg | ORAL_TABLET | Freq: Four times a day (QID) | ORAL | Status: DC | PRN

## 2023-03-10 MED ORDER — RISAQUAD PO CAPS
1.0000 | ORAL_CAPSULE | Freq: Every day | ORAL | Status: DC
  Administered 2023-03-10 – 2023-03-12 (×3): 1 via ORAL
  Filled 2023-03-10 (×3): qty 1

## 2023-03-10 MED ORDER — MENTHOL 3 MG MT LOZG: 1.0000 | LOZENGE | OROMUCOSAL | Status: DC | PRN

## 2023-03-10 MED ORDER — OXYCODONE HCL 5 MG PO TABS
10.0000 mg | ORAL_TABLET | ORAL | Status: DC | PRN
  Administered 2023-03-10 – 2023-03-11 (×5): 10 mg via ORAL
  Administered 2023-03-12 (×2): 15 mg via ORAL
  Filled 2023-03-10: qty 2
  Filled 2023-03-10 (×2): qty 3
  Filled 2023-03-10 (×4): qty 2

## 2023-03-10 MED ORDER — MIDAZOLAM HCL 2 MG/2ML IJ SOLN
INTRAMUSCULAR | Status: DC | PRN
  Administered 2023-03-10: 2 mg via INTRAVENOUS

## 2023-03-10 MED ORDER — TRANEXAMIC ACID-NACL 1000-0.7 MG/100ML-% IV SOLN
1000.0000 mg | INTRAVENOUS | Status: AC
  Administered 2023-03-10: 1000 mg via INTRAVENOUS
  Filled 2023-03-10: qty 100

## 2023-03-10 MED ORDER — BUPIVACAINE LIPOSOME 1.3 % IJ SUSP
INTRAMUSCULAR | Status: DC | PRN
  Administered 2023-03-10: 20 mL

## 2023-03-10 MED ORDER — DOCUSATE SODIUM 100 MG PO CAPS
100.0000 mg | ORAL_CAPSULE | Freq: Two times a day (BID) | ORAL | 2 refills | Status: AC
  Filled 2023-03-10: qty 60, 30d supply, fill #0

## 2023-03-10 MED ORDER — 0.9 % SODIUM CHLORIDE (POUR BTL) OPTIME
TOPICAL | Status: DC | PRN
  Administered 2023-03-10: 1000 mL

## 2023-03-10 MED ORDER — DOCUSATE SODIUM 100 MG PO CAPS
100.0000 mg | ORAL_CAPSULE | Freq: Two times a day (BID) | ORAL | Status: DC
  Administered 2023-03-10 – 2023-03-12 (×5): 100 mg via ORAL
  Filled 2023-03-10 (×5): qty 1

## 2023-03-10 MED ORDER — OXYCODONE HCL 5 MG PO TABS: 5.0000 mg | ORAL_TABLET | Freq: Once | ORAL | Status: DC

## 2023-03-10 SURGICAL SUPPLY — 63 items
ATTUNE PS FEM RT SZ 5 CEM KNEE (Femur) IMPLANT
ATTUNE PSRP INSR SZ5 6 KNEE (Insert) IMPLANT
BAG COUNTER SPONGE SURGICOUNT (BAG) IMPLANT
BAG DECANTER FOR FLEXI CONT (MISCELLANEOUS) ×1 IMPLANT
BAG ZIPLOCK 12X15 (MISCELLANEOUS) IMPLANT
BASE TIBIAL ROT PLAT SZ 5 KNEE (Knees) IMPLANT
BLADE SAW SGTL 11.0X1.19X90.0M (BLADE) ×1 IMPLANT
BLADE SAW SGTL 13.0X1.19X90.0M (BLADE) ×1 IMPLANT
BLADE SURG SZ10 CARB STEEL (BLADE) ×2 IMPLANT
BNDG ELASTIC 4INX 5YD STR LF (GAUZE/BANDAGES/DRESSINGS) ×1 IMPLANT
BNDG ELASTIC 6INX 5YD STR LF (GAUZE/BANDAGES/DRESSINGS) ×1 IMPLANT
BOWL SMART MIX CTS (DISPOSABLE) ×1 IMPLANT
CEMENT HV SMART SET (Cement) ×2 IMPLANT
COVER SURGICAL LIGHT HANDLE (MISCELLANEOUS) ×1 IMPLANT
CUFF TRNQT CYL 34X4.125X (TOURNIQUET CUFF) ×1 IMPLANT
DRAPE INCISE IOBAN 66X45 STRL (DRAPES) IMPLANT
DRAPE SHEET LG 3/4 BI-LAMINATE (DRAPES) ×1 IMPLANT
DRAPE SURG ORHT 6 SPLT 77X108 (DRAPES) ×2 IMPLANT
DRAPE TOP 10253 STERILE (DRAPES) ×1 IMPLANT
DRAPE U-SHAPE 47X51 STRL (DRAPES) ×1 IMPLANT
DRSG AQUACEL AG ADV 3.5X10 (GAUZE/BANDAGES/DRESSINGS) ×1 IMPLANT
DRSG TEGADERM 4X4.75 (GAUZE/BANDAGES/DRESSINGS) IMPLANT
DURAPREP 26ML APPLICATOR (WOUND CARE) ×1 IMPLANT
ELECT BLADE TIP CTD 4 INCH (ELECTRODE) ×1 IMPLANT
ELECT REM PT RETURN 15FT ADLT (MISCELLANEOUS) ×1 IMPLANT
EVACUATOR 1/8 PVC DRAIN (DRAIN) IMPLANT
GAUZE SPONGE 2X2 8PLY STRL LF (GAUZE/BANDAGES/DRESSINGS) IMPLANT
GLOVE BIO SURGEON STRL SZ7 (GLOVE) ×1 IMPLANT
GLOVE BIOGEL PI IND STRL 7.0 (GLOVE) ×1 IMPLANT
GLOVE BIOGEL PI IND STRL 8 (GLOVE) ×1 IMPLANT
GLOVE SURG SS PI 8.0 STRL IVOR (GLOVE) ×1 IMPLANT
GOWN STRL REUS W/ TWL XL LVL3 (GOWN DISPOSABLE) ×2 IMPLANT
HEMOSTAT SPONGE AVITENE ULTRA (HEMOSTASIS) IMPLANT
HOLDER FOLEY CATH W/STRAP (MISCELLANEOUS) IMPLANT
IMMOBILIZER KNEE 20 (SOFTGOODS) ×1
IMMOBILIZER KNEE 20 THIGH 36 (SOFTGOODS) ×1 IMPLANT
KIT TURNOVER KIT A (KITS) IMPLANT
MANIFOLD NEPTUNE II (INSTRUMENTS) ×1 IMPLANT
NS IRRIG 1000ML POUR BTL (IV SOLUTION) IMPLANT
PACK TOTAL KNEE CUSTOM (KITS) ×1 IMPLANT
PATELLA MEDIAL ATTUN 35MM KNEE (Knees) IMPLANT
PIN STEINMAN FIXATION KNEE (PIN) IMPLANT
PROTECTOR NERVE ULNAR (MISCELLANEOUS) ×1 IMPLANT
SAW OSC TIP CART 19.5X105X1.3 (SAW) IMPLANT
SEALER BIPOLAR AQUA 6.0 (INSTRUMENTS) IMPLANT
SET HNDPC FAN SPRY TIP SCT (DISPOSABLE) ×1 IMPLANT
SOLUTION PRONTOSAN WOUND 350ML (IRRIGATION / IRRIGATOR) ×1 IMPLANT
SPIKE FLUID TRANSFER (MISCELLANEOUS) ×1 IMPLANT
STAPLER VISISTAT (STAPLE) IMPLANT
STRIP CLOSURE SKIN 1/2X4 (GAUZE/BANDAGES/DRESSINGS) IMPLANT
SUT BONE WAX W31G (SUTURE) ×1 IMPLANT
SUT MNCRL AB 4-0 PS2 18 (SUTURE) IMPLANT
SUT STRATAFIX 0 PDS 27 VIOLET (SUTURE) ×1
SUT VIC AB 1 CT1 27XBRD ANTBC (SUTURE) ×3 IMPLANT
SUT VIC AB 2-0 CT1 TAPERPNT 27 (SUTURE) ×3 IMPLANT
SUTURE STRATFX 0 PDS 27 VIOLET (SUTURE) ×1 IMPLANT
SYR 3ML LL SCALE MARK (SYRINGE) IMPLANT
TIBIAL BASE ROT PLAT SZ 5 KNEE (Knees) ×1 IMPLANT
TRAY FOLEY MTR SLVR 16FR STAT (SET/KITS/TRAYS/PACK) ×1 IMPLANT
TUBE SUCTION HIGH CAP CLEAR NV (SUCTIONS) ×1 IMPLANT
WATER STERILE IRR 1000ML POUR (IV SOLUTION) ×1 IMPLANT
WIPE CHG 2% PREP (PERSONAL CARE ITEMS) ×1 IMPLANT
WRAP KNEE MAXI GEL POST OP (GAUZE/BANDAGES/DRESSINGS) ×1 IMPLANT

## 2023-03-10 NOTE — Interval H&P Note (Signed)
History and Physical Interval Note:  03/10/2023 8:19 AM  Alexandria Middleton  has presented today for surgery, with the diagnosis of degenerative joint disease Rt knee.  The various methods of treatment have been discussed with the patient and family. After consideration of risks, benefits and other options for treatment, the patient has consented to  Procedure(s): TOTAL KNEE ARTHROPLASTY (Right) as a surgical intervention.  The patient's history has been reviewed, patient examined, no change in status, stable for surgery.  I have reviewed the patient's chart and labs.  Questions were answered to the patient's satisfaction.     Javier Docker

## 2023-03-10 NOTE — Brief Op Note (Signed)
03/10/2023  8:19 AM  PATIENT:  Alexandria Middleton  62 y.o. female  PRE-OPERATIVE DIAGNOSIS:  degenerative joint disease Rt knee  POST-OPERATIVE DIAGNOSIS:  * No post-op diagnosis entered *  PROCEDURE:  Procedure(s): TOTAL KNEE ARTHROPLASTY (Right)  SURGEON:  Surgeons and Role:    Jene Every, MD - Primary  PHYSICIAN ASSISTANT:   ASSISTANTS: Bissell   ANESTHESIA:   spinal  EBL:  50  BLOOD ADMINISTERED:none  DRAINS: none   LOCAL MEDICATIONS USED:  MARCAINE     SPECIMEN:  No Specimen  DISPOSITION OF SPECIMEN:  N/A  COUNTS:  YES  TOURNIQUET:  * Missing tourniquet times found for documented tourniquets in log: [id number] *  DICTATION: .Other Dictation: Dictation Number   [id number]  PLAN OF CARE: Admit for overnight observation  PATIENT DISPOSITION:  PACU - hemodynamically stable.   Delay start of Pharmacological VTE agent (>24hrs) due to surgical blood loss or risk of bleeding: no

## 2023-03-10 NOTE — Discharge Instructions (Signed)
Elevate leg above heart 6x a day for each Use knee immobilizer while walking until can SLR x 10 Use knee immobilizer in bed to keep knee in extension Aquacel dressing may remain in place for one week. May shower with aquacel dressing in place. If the dressing becomes saturated or peels off, you may remove aquacel dressing. At one week, remove dressing. Do not remove steri-strips if they are present. Place new dressing with gauze and tape or ACE bandage which should be kept clean and dry and changed daily.  INSTRUCTIONS AFTER JOINT REPLACEMENT   Remove items at home which could result in a fall. This includes throw rugs or furniture in walking pathways ICE to the affected joint every three hours while awake for 30 minutes at a time, for at least the first 3-5 days, and then as needed for pain and swelling.  Continue to use ice for pain and swelling. You may notice swelling that will progress down to the foot and ankle.  This is normal after surgery.  Elevate your leg when you are not up walking on it.   Continue to use the breathing machine you got in the hospital (incentive spirometer) which will help keep your temperature down.  It is common for your temperature to cycle up and down following surgery, especially at night when you are not up moving around and exerting yourself.  The breathing machine keeps your lungs expanded and your temperature down.   DIET:  As you were doing prior to hospitalization, we recommend a well-balanced diet.  DRESSING / WOUND CARE / SHOWERING  Keep the surgical dressing on for one week.  The dressing is water proof, so you can shower without any extra covering.  IF THE DRESSING FALLS OFF or the wound gets wet inside, or at the one week point, change the dressing with sterile gauze.  Please use good hand washing techniques before changing the dressing.  Do not use any lotions or creams on the incision until instructed by your surgeon.    ACTIVITY  Increase  activity slowly as tolerated, but follow the weight bearing instructions below.   No driving for 6 weeks or until further direction given by your physician.  You cannot drive while taking narcotics.  No lifting or carrying greater than 10 lbs. until further directed by your surgeon. Avoid periods of inactivity such as sitting longer than an hour when not asleep. This helps prevent blood clots.  You may return to work once you are authorized by your doctor.     WEIGHT BEARING   Weight bearing as tolerated with assist device (walker, cane, etc) as directed, use it as long as suggested by your surgeon or therapist, typically at least 4-6 weeks.   EXERCISES  Results after joint replacement surgery are often greatly improved when you follow the exercise, range of motion and muscle strengthening exercises prescribed by your doctor. Safety measures are also important to protect the joint from further injury. Any time any of these exercises cause you to have increased pain or swelling, decrease what you are doing until you are comfortable again and then slowly increase them. If you have problems or questions, call your caregiver or physical therapist for advice.   Rehabilitation is important following a joint replacement. After just a few days of immobilization, the muscles of the leg can become weakened and shrink (atrophy).  These exercises are designed to build up the tone and strength of the thigh and leg muscles and  to improve motion. Often times heat used for twenty to thirty minutes before working out will loosen up your tissues and help with improving the range of motion but do not use heat for the first two weeks following surgery (sometimes heat can increase post-operative swelling).   These exercises can be done on a training (exercise) mat, on the floor, on a table or on a bed. Use whatever works the best and is most comfortable for you.    Use music or television while you are exercising so  that the exercises are a pleasant break in your day. This will make your life better with the exercises acting as a break in your routine that you can look forward to.   Perform all exercises about fifteen times, three times per day or as directed.  You should exercise both the operative leg and the other leg as well.  Exercises include:   Quad Sets - Tighten up the muscle on the front of the thigh (Quad) and hold for 5-10 seconds.   Straight Leg Raises - With your knee straight (if you were given a brace, keep it on), lift the leg to 60 degrees, hold for 3 seconds, and slowly lower the leg.  Perform this exercise against resistance later as your leg gets stronger.  Leg Slides: Lying on your back, slowly slide your foot toward your buttocks, bending your knee up off the floor (only go as far as is comfortable). Then slowly slide your foot back down until your leg is flat on the floor again.  Angel Wings: Lying on your back spread your legs to the side as far apart as you can without causing discomfort.  Hamstring Strength:  Lying on your back, push your heel against the floor with your leg straight by tightening up the muscles of your buttocks.  Repeat, but this time bend your knee to a comfortable angle, and push your heel against the floor.  You may put a pillow under the heel to make it more comfortable if necessary.   A rehabilitation program following joint replacement surgery can speed recovery and prevent re-injury in the future due to weakened muscles. Contact your doctor or a physical therapist for more information on knee rehabilitation.    CONSTIPATION  Constipation is defined medically as fewer than three stools per week and severe constipation as less than one stool per week.  Even if you have a regular bowel pattern at home, your normal regimen is likely to be disrupted due to multiple reasons following surgery.  Combination of anesthesia, postoperative narcotics, change in appetite and  fluid intake all can affect your bowels.   YOU MUST use at least one of the following options; they are listed in order of increasing strength to get the job done.  They are all available over the counter, and you may need to use some, POSSIBLY even all of these options:    Drink plenty of fluids (prune juice may be helpful) and high fiber foods Colace 100 mg by mouth twice a day  Senokot for constipation as directed and as needed Dulcolax (bisacodyl), take with full glass of water  Miralax (polyethylene glycol) once or twice a day as needed.  If you have tried all these things and are unable to have a bowel movement in the first 3-4 days after surgery call either your surgeon or your primary doctor.    If you experience loose stools or diarrhea, hold the medications until you stool forms  back up.  If your symptoms do not get better within 1 week or if they get worse, check with your doctor.  If you experience "the worst abdominal pain ever" or develop nausea or vomiting, please contact the office immediately for further recommendations for treatment.   ITCHING:  If you experience itching with your medications, try taking only a single pain pill, or even half a pain pill at a time.  You can also use Benadryl over the counter for itching or also to help with sleep.   TED HOSE STOCKINGS:  Use stockings on both legs until for at least 2 weeks or as directed by physician office. They may be removed at night for sleeping.  MEDICATIONS:  See your medication summary on the "After Visit Summary" that nursing will review with you.  You may have some home medications which will be placed on hold until you complete the course of blood thinner medication.  It is important for you to complete the blood thinner medication as prescribed.  PRECAUTIONS:  If you experience chest pain or shortness of breath - call 911 immediately for transfer to the hospital emergency department.   If you develop a fever greater  that 101 F, purulent drainage from wound, increased redness or drainage from wound, foul odor from the wound/dressing, or calf pain - CONTACT YOUR SURGEON.                                                   FOLLOW-UP APPOINTMENTS:  If you do not already have a post-op appointment, please call the office for an appointment to be seen by your surgeon.  Guidelines for how soon to be seen are listed in your "After Visit Summary", but are typically between 1-4 weeks after surgery.  OTHER INSTRUCTIONS:   Knee Replacement:  Do not place pillow under knee, focus on keeping the knee straight while resting. CPM instructions: 0-90 degrees, 2 hours in the morning, 2 hours in the afternoon, and 2 hours in the evening. Place foam block, curve side up under heel at all times except when in CPM or when walking.  DO NOT modify, tear, cut, or change the foam block in any way.  POST-OPERATIVE OPIOID TAPER INSTRUCTIONS: It is important to wean off of your opioid medication as soon as possible. If you do not need pain medication after your surgery it is ok to stop day one. Opioids include: Codeine, Hydrocodone(Norco, Vicodin), Oxycodone(Percocet, oxycontin) and hydromorphone amongst others.  Long term and even short term use of opiods can cause: Increased pain response Dependence Constipation Depression Respiratory depression And more.  Withdrawal symptoms can include Flu like symptoms Nausea, vomiting And more Techniques to manage these symptoms Hydrate well Eat regular healthy meals Stay active Use relaxation techniques(deep breathing, meditating, yoga) Do Not substitute Alcohol to help with tapering If you have been on opioids for less than two weeks and do not have pain than it is ok to stop all together.  Plan to wean off of opioids This plan should start within one week post op of your joint replacement. Maintain the same interval or time between taking each dose and first decrease the dose.  Cut  the total daily intake of opioids by one tablet each day Next start to increase the time between doses. The last dose that should be eliminated  is the evening dose.   MAKE SURE YOU:  Understand these instructions.  Get help right away if you are not doing well or get worse.    Thank you for letting us be a part of your medical care team.  It is a privilege we respect greatly.  We hope these instructions will help you stay on track for a fast and full recovery!

## 2023-03-10 NOTE — Transfer of Care (Signed)
Immediate Anesthesia Transfer of Care Note  Patient: Alexandria Middleton  Procedure(s) Performed: TOTAL KNEE ARTHROPLASTY (Right: Knee)  Patient Location: PACU  Anesthesia Type:Spinal  Level of Consciousness: awake, alert , and oriented  Airway & Oxygen Therapy: Patient Spontanous Breathing  Post-op Assessment: Report given to RN and Post -op Vital signs reviewed and stable  Post vital signs: Reviewed and stable  Last Vitals:  Vitals Value Taken Time  BP 109/59 03/10/23 1123  Temp    Pulse 70 03/10/23 1127  Resp 8 03/10/23 1127  SpO2 94 % 03/10/23 1127  Vitals shown include unfiled device data.  Last Pain:  Vitals:   03/10/23 0722  TempSrc:   PainSc: 0-No pain         Complications: No notable events documented.

## 2023-03-10 NOTE — Op Note (Unsigned)
NAMEALLEN, COHENOUR MEDICAL RECORD NO: [id number] ACCOUNT NO: [id number] DATE OF BIRTH: [birth year] FACILITY: [facility] LOCATION: [facility] PHYSICIAN: Javier Docker, MD  Operative Report   DATE OF PROCEDURE: 03/10/2023  PREOPERATIVE DIAGNOSIS:  End-stage osteoarthrosis, varus deformity of the right knee.  POSTOPERATIVE DIAGNOSIS:  End-stage osteoarthrosis, varus deformity of the right knee.  PROCEDURE PERFORMED:  Right total knee arthroplasty utilizing an Attune rotating platform, 5 femur, 5 tibia, 6 mm insert, and 35 patella.  ANESTHESIA:  Spinal.  ASSISTANT:  Lanna Poche, PA.  HISTORY:  A 62 year old with end-stage osteoarthrosis, medial compartment, bone-on-bone, refractory to conservative treatment.  Negative affect to her activities of daily living indicated for replacement of the degenerated joint.  Risks and benefits were  discussed including bleeding, infection, damage to neurovascular structures.  No change in symptoms or worsening symptoms, DVT, PE, anesthetic complications, etc.  DESCRIPTION OF PROCEDURE:  With the patient in the supine position.  After induction of adequate spinal anesthesia, 2 g of Kefzol, the right lower extremity was prepped, draped, and exsanguinated in the usual sterile fashion.  The thigh tourniquet was  inflated to 225 mmHg.  A midline incision was then made over the knee.  Full-thickness flaps were developed.  A medial parapatellar arthrotomy was performed.  The soft tissue was elevated medially, preserving the MCL, patellar gently everted, knee  flexed, debrided the fat pad, bone-on-bone arthrosis of the medial compartment of the patellofemoral joint.  A Leksell rongeur was utilized to remove the remnants of the medial or lateral menisci and to initiate a starting hole just above the femoral  notch.  I used a femoral drill in line with the femur.  Then a T-handle irrigated intramedullary guide.  T-handle gently noted within the femoral canal  T-handle.  Intramedullary guide 5-degree right with 9 off the distal femur.  This was then pinned.  I  performed a distal femoral cut.  Following this, we subluxed the tibia.  Further remnants of the menisci excised.  Left side was medially, bone-on-bone.  External alignment guided to other defect, which was medially parallel to the shaft, bisecting the  tibiotalar joint, 3-degree slope.  This was approximately 9 off the lateral compartment.  This was pinned.  I performed the cut, protecting the soft tissues posteriorly at all times.  I then sized our extension block to a 6, which was satisfactory.  I  then flexed the knee and sized the femur off the anterior cortex, 3 degrees of external rotation, sized to a 5, Pinned.  I then used a distal femoral block, performed an anterior-posterior chamfer cut without notching.  Good baby grand piano type cut off  the distal femur.  Then, I turned attention back to the tibia, subluxed, sized to a 5, maximizing the coverage just to the medial aspect of the tibial tubercle, pinned, harvested bone centrally, and impacted into the distal femur.  I then drilled  centrally and then used our punch guide and then turned attention back to the femur.  I used our box cut guide.  The normal blade, which fit into the box was not available.  The one that was substituted was a larger width and unable to fit that into the  box.  I used a combination of a rasp and osteotome to perform our box cut, which took additional time.  I then used our trial femur.  It did not sit flush initially.  We used a rasp and other oscillating saw to further contour the cut.  There was a small  piece of bone off the medial aspect of the medial box cut.  We finally got the femur to fit flush.  This was a 5.  We drilled our lug holes.  I placed a 6 mm insert, reduced it, and I had full extension, full flexion, good stability with varus and  valgus stressing at 0 and 30 degrees.  Negative anterior drawer.   Everted the patella measured to a 24, plane to a 15 utilizing patellar jig.  This was then sized to a 35 with a paddle parallel to the joint surface.  I drilled our peg holes.  I placed a  trial of patella, reduced it, and had excellent patellofemoral tracking.  All trials were removed, checked posteriorly, cauterized the geniculates, popliteus, and capsule were intact.  Used pulsatile lavage to clean all surfaces and the operative site.   The knee was flexed.  All surfaces thoroughly dried, mixed cement on the back table under vacuum.  I then used cement and digitally pressurized it and placed it in the proximal tibia and on the tibial component and impacted into place.  Redundant cement  was removed.  I cemented the femoral component.  It was cemented on the distal femur as well.  This was impacted into place.  Redundant cement was removed.  I placed a trial insert, reduced it, held an axial load throughout the curing of the cement.  It  sit flush and held it in full extension.  I cemented a clamp to the patella.  Marcaine with epinephrine and Prontosan was placed in the wound and was covered during the carrying of the cement.  After that, the tourniquet was deflated for 65 minutes.  Any  minor bleeding was cauterized.  Excellent stability was noted.  I removed the trial.  I meticulously removed all redundant cement, copiously irrigated with Prontosan, placed a permanent 6, reduced it, had full extension, full flexion, good stability  with varus and valgus stress at 0 or 30 degrees.  Negative anterior drawer.  I then reapproximated the patellar arthrotomy with the knee in mid flexion with #1 Vicryl interrupted figure-of-eight sutures.  A moderate lateral retinacular release was  performed as well.  Following this, I had excellent patellofemoral tracking.  Copiously irrigated once again.  Subcuticular with 2-0 and the skin with subcuticular Monocryl.  Sterile dressing was applied.  She had flexion to gravity  at 90 degrees and  excellent patellofemoral tracking.  Sterile dressing, knee immobilizer, and transported to the recovery room in satisfactory condition.  The patient tolerated the procedure well.  No complications.  Assistant Orland Penman, [place] was used for  application, patient positioning, exposure, closure.  BLOOD LOSS:  50 mL.   PUS D: 03/10/2023 11:06:56 am T: 03/10/2023 12:09:00 pm  JOB: [id number]/ [id number]

## 2023-03-10 NOTE — H&P (Signed)
TOTAL KNEE ADMISSION H&P  Patient is being admitted for right total knee arthroplasty.  Subjective:  Chief Complaint:right knee pain.  HPI: Alexandria Middleton, 62 y.o. female, has a history of pain and functional disability in the right knee due to arthritis and has failed non-surgical conservative treatments for greater than 12 weeks to includeNSAID's and/or analgesics, corticosteriod injections, and flexibility and strengthening excercises.  Onset of symptoms was gradual, starting 4 years ago with gradually worsening course since that time. The patient noted no past surgery on the right knee(s).  Patient currently rates pain in the right knee(s) at 8 out of 10 with activity. Patient has worsening of pain with activity and weight bearing.  Patient has evidence of subchondral sclerosis, periarticular osteophytes, and joint space narrowing by imaging studies. This patient has had  pain . There is no active infection.  Patient Active Problem List   Diagnosis Date Noted   HYPERTRIGLYCERIDEMIA 10/05/2006   Anxiety state 10/05/2006   PERIMENOPAUSAL STATUS 10/05/2006   Sinusitis, chronic 05/13/2006   WALKING PNEUMONIA 04/15/2006   Past Medical History:  Diagnosis Date   Allergies    Anxiety    Arthritis    Depression    Family history of adverse reaction to anesthesia    mother PONV   GERD (gastroesophageal reflux disease)    Hypercholesteremia    PONV (postoperative nausea and vomiting)     Past Surgical History:  Procedure Laterality Date   BREAST BIOPSY Left 07/16/2022   MM LT BREAST BX W LOC DEV 1ST LESION IMAGE BX SPEC STEREO GUIDE 07/16/2022 [facility]   COLONOSCOPY     CYSTECTOMY     ESOPHAGOGASTRODUODENOSCOPY (EGD) WITH PROPOFOL     OOPHORECTOMY      Current Facility-Administered Medications  Medication Dose Route Frequency Provider Last Rate Last Admin   acetaminophen (OFIRMEV) IV 1,000 mg  1,000 mg Intravenous On Call to OR Andrez Grime M, PA-C       ceFAZolin  (ANCEF) IVPB 2g/100 mL premix  2 g Intravenous On Call to OR Andrez Grime M, PA-C       lactated ringers infusion   Intravenous Continuous Bissell, Jaclyn M, PA-C       lactated ringers infusion   Intravenous Continuous Bethena Midget, MD 10 mL/hr at 03/10/23 0728 New Bag at 03/10/23 0728   tranexamic acid (CYKLOKAPRON) IVPB 1,000 mg  1,000 mg Intravenous To OR Bissell, Jaclyn M, PA-C       Allergies  Allergen Reactions   Codeine     GI Intolerance    Social History   Tobacco Use   Smoking status: Never   Smokeless tobacco: Never  Substance Use Topics   Alcohol use: Yes    Comment: occ    Family History  Problem Relation Age of Onset   Breast cancer Neg Hx      Review of Systems  Musculoskeletal:  Positive for arthralgias and joint swelling.    Objective:  Physical Exam  Vital signs in last 24 hours: Temp:  [98.1 F (36.7 C)] 98.1 F (36.7 C) (12/04 0714) Pulse Rate:  [65] 65 (12/04 0714) Resp:  [10] 10 (12/04 0714) BP: (122)/(63) 122/63 (12/04 0714) SpO2:  [96 %] 96 % (12/04 0714) Weight:  [77.7 kg] 77.7 kg (12/04 0722)  Labs:   Estimated body mass index is 29.39 kg/m as calculated from the following:   Height as of 03/03/23: 5\' 4"  (1.626 m).   Weight as of this encounter: 77.7 kg.  Imaging Review Plain radiographs demonstrate severe degenerative joint disease of the right knee(s). The overall alignment ismild varus. The bone quality appears to be good for age and reported activity level.      Assessment/Plan:  End stage arthritis, right knee   The patient history, physical examination, clinical judgment of the provider and imaging studies are consistent with end stage degenerative joint disease of the right knee(s) and total knee arthroplasty is deemed medically necessary. The treatment options including medical management, injection therapy arthroscopy and arthroplasty were discussed at length. The risks and benefits of total knee arthroplasty were  presented and reviewed. The risks due to aseptic loosening, infection, stiffness, patella tracking problems, thromboembolic complications and other imponderables were discussed. The patient acknowledged the explanation, agreed to proceed with the plan and consent was signed. Patient is being admitted for inpatient treatment for surgery, pain control, PT, OT, prophylactic antibiotics, VTE prophylaxis, progressive ambulation and ADL's and discharge planning. The patient is planning to be discharged home with home health services     Patient's anticipated LOS is less than 2 midnights, meeting these requirements: - Younger than 27 - Lives within 1 hour of care - Has a competent adult at home to recover with post-op recover - NO history of  - Chronic pain requiring opiods  - Diabetes  - Coronary Artery Disease  - Heart failure  - Heart attack  - Stroke  - DVT/VTE  - Cardiac arrhythmia  - Respiratory Failure/COPD  - Renal failure  - Anemia  - Advanced Liver disease

## 2023-03-10 NOTE — Evaluation (Signed)
Physical Therapy Evaluation Patient Details Name: Alexandria Middleton MRN: [id number] DOB: 26-Sep-1960 Today's Date: 03/10/2023  History of Present Illness  Pt is 62 yo female admitted 03/10/23 for R TKA.  Pt with hx including but not limited to arthritis, anxiety, GERD, HCL  Clinical Impression  Pt is s/p TKA resulting in the deficits listed below (see PT Problem List). At baseline, pt is independent and ambulatory without AD.  She has support at home but does have 7 stairs to enter with no railing.  Pt motivated to work with therapy but was only able to tolerate transfer to chair due to pain. Pt expected to progress well with therapy as pain improves.  Pt will benefit from acute skilled PT to increase their independence and safety with mobility to allow discharge.          If plan is discharge home, recommend the following: A little help with walking and/or transfers;A little help with bathing/dressing/bathroom;Assistance with cooking/housework;Help with stairs or ramp for entrance   Can travel by private vehicle        Equipment Recommendations Rolling walker (2 wheels)  Recommendations for Other Services       Functional Status Assessment Patient has had a recent decline in their functional status and demonstrates the ability to make significant improvements in function in a reasonable and predictable amount of time.     Precautions / Restrictions Precautions Precautions: Fall;Knee Restrictions Weight Bearing Restrictions: Yes RLE Weight Bearing: Weight bearing as tolerated      Mobility  Bed Mobility Overal bed mobility: Needs Assistance Bed Mobility: Supine to Sit     Supine to sit: Min assist     General bed mobility comments: min A R LE    Transfers Overall transfer level: Needs assistance Equipment used: Rolling walker (2 wheels) Transfers: Sit to/from Stand, Bed to chair/wheelchair/BSC Sit to Stand: Min assist, From elevated surface   Step pivot transfers: Min  assist       General transfer comment: Small steps to chair with cues for RW    Ambulation/Gait               General Gait Details: held due to pain  Stairs            Wheelchair Mobility     Tilt Bed    Modified Rankin (Stroke Patients Only)       Balance Overall balance assessment: Needs assistance Sitting-balance support: No upper extremity supported Sitting balance-Leahy Scale: Good     Standing balance support: Bilateral upper extremity supported, Reliant on assistive device for balance Standing balance-Leahy Scale: Poor Standing balance comment: steady with RW                             Pertinent Vitals/Pain Pain Assessment Pain Assessment: 0-10 Pain Score: 5  Pain Location: r knee Pain Descriptors / Indicators: Aching Pain Intervention(s): Limited activity within patient's tolerance, Monitored during session, Premedicated before session, Repositioned    Home Living Family/patient expects to be discharged to:: Private residence Living Arrangements: Spouse/significant other Available Help at Discharge:  (wife, Wynona Canes available) Type of Home: House Home Access: Stairs to enter   [employer] of Steps: 7 in front no rail; 3 in back but long walk and up hill (does have rail but reports not good shape)   Home Layout: One level Home Equipment: Tub bench;Hand held shower head;Cane - single point Additional Comments: borrowed rollator  Prior Function Prior Level of Function : Driving;Working/employed;Independent/Modified Independent             Mobility Comments: could ambulate in community ADLs Comments: independent adls and iadls; works in Community education officer     Extremity/Trunk Assessment   Upper Extremity Assessment Upper Extremity Assessment: Overall WFL for tasks assessed    Lower Extremity Assessment Lower Extremity Assessment: LLE deficits/detail;RLE deficits/detail RLE Deficits / Details: Expected post op  changes; ROM: R knee ~5 to 50 degrees; MMT: ankle 5/5, knee 2/5, hip 2/5 LLE Deficits / Details: ROM: WFL; MMT 5/5    Cervical / Trunk Assessment Cervical / Trunk Assessment: Normal  Communication      Cognition Arousal: Alert Behavior During Therapy: WFL for tasks assessed/performed Overall Cognitive Status: Within Functional Limits for tasks assessed                                          General Comments General comments (skin integrity, edema, etc.): vss    Exercises     Assessment/Plan    PT Assessment Patient needs continued PT services  PT Problem List Decreased strength;Pain;Decreased range of motion;Decreased activity tolerance;Decreased balance;Decreased mobility;Decreased knowledge of use of DME       PT Treatment Interventions DME instruction;Therapeutic exercise;Gait training;Stair training;Functional mobility training;Therapeutic activities;Patient/family education;Modalities;Balance training    PT Goals (Current goals can be found in the Care Plan section)  Acute Rehab PT Goals Patient Stated Goal: return home; [place] in May PT Goal Formulation: With patient/family Time For Goal Achievement: 03/24/23 Potential to Achieve Goals: Good    Frequency 7X/week     Co-evaluation               AM-PAC PT "6 Clicks" Mobility  Outcome Measure Help needed turning from your back to your side while in a flat bed without using bedrails?: A Little Help needed moving from lying on your back to sitting on the side of a flat bed without using bedrails?: A Little Help needed moving to and from a bed to a chair (including a wheelchair)?: A Little Help needed standing up from a chair using your arms (e.g., wheelchair or bedside chair)?: A Little Help needed to walk in hospital room?: Total Help needed climbing 3-5 steps with a railing? : Total 6 Click Score: 14    End of Session Equipment Utilized During Treatment: Gait belt Activity Tolerance:  Patient limited by pain Patient left: with chair alarm set;in chair;with call bell/phone within reach Nurse Communication: Mobility status PT Visit Diagnosis: Other abnormalities of gait and mobility (R26.89);Muscle weakness (generalized) (M62.81)    Time: 1600-1630 PT Time Calculation (min) (ACUTE ONLY): 30 min   Charges:   PT Evaluation $PT Eval Low Complexity: 1 Low PT Treatments $Therapeutic Activity: 8-22 mins PT General Charges $$ ACUTE PT VISIT: 1 Visit         Anise Salvo, PT Acute Rehab [facility] [phone]   Rayetta Humphrey 03/10/2023, 5:14 PM

## 2023-03-10 NOTE — Anesthesia Procedure Notes (Signed)
Anesthesia Regional Block: Adductor canal block   Pre-Anesthetic Checklist: , timeout performed,  Correct Patient, Correct Site, Correct Laterality,  Correct Procedure, Correct Position, site marked,  Risks and benefits discussed,  Surgical consent,  Pre-op evaluation,  At surgeon's request and post-op pain management  Laterality: Right  Prep: Dura Prep       Needles:  Injection technique: Single-shot  Needle Type: Echogenic Stimulator Needle     Needle Length: 10cm  Needle Gauge: 20     Additional Needles:   Procedures:,,,, ultrasound used (permanent image in chart),,    Narrative:  Start time: 03/10/2023 8:15 AM End time: 03/10/2023 8:20 AM Injection made incrementally with aspirations every 5 mL.  Performed by: Personally  Anesthesiologist: Atilano Median, DO  Additional Notes: Patient identified. Risks/Benefits/Options discussed with patient including but not limited to bleeding, infection, nerve damage, failed block, incomplete pain control. Patient expressed understanding and wished to proceed. All questions were answered. Sterile technique was used throughout the entire procedure. Please see nursing notes for vital signs. Aspirated in 5cc intervals with injection for negative confirmation. Patient was given instructions on fall risk and not to get out of bed. All questions and concerns addressed with instructions to call with any issues or inadequate analgesia.

## 2023-03-10 NOTE — Anesthesia Procedure Notes (Signed)
Spinal  Patient location during procedure: OR Start time: 03/10/2023 8:55 AM End time: 03/10/2023 8:57 AM Staffing Performed: anesthesiologist  Anesthesiologist: Atilano Median, DO Performed by: Atilano Median, DO Authorized by: Atilano Median, DO   Preanesthetic Checklist Completed: patient identified, IV checked, site marked, risks and benefits discussed, surgical consent, monitors and equipment checked, pre-op evaluation and timeout performed Spinal Block Patient position: sitting Prep: DuraPrep Patient monitoring: heart rate, cardiac monitor, continuous pulse ox and blood pressure Approach: midline Location: L3-4 Injection technique: single-shot Needle Needle type: Pencan  Needle gauge: 24 G Needle length: 10 cm Assessment Events: CSF return Additional Notes Patient identified. Risks/Benefits/Options discussed with patient including but not limited to bleeding, infection, nerve damage, paralysis, failed block, incomplete pain control, headache, blood pressure changes, nausea, vomiting, reactions to medications, itching and postpartum back pain. Confirmed with bedside nurse the patient's most recent platelet count. Confirmed with patient that they are not currently taking any anticoagulation, have any bleeding history or any family history of bleeding disorders. Patient expressed understanding and wished to proceed. All questions were answered. Sterile technique was used throughout the entire procedure. Please see nursing notes for vital signs. Warning signs of high block given to the patient including shortness of breath, tingling/numbness in hands, complete motor block, or any concerning symptoms with instructions to call for help. Patient was given instructions on fall risk and not to get out of bed. All questions and concerns addressed with instructions to call with any issues or inadequate analgesia.

## 2023-03-11 ENCOUNTER — Other Ambulatory Visit (HOSPITAL_COMMUNITY): Payer: Self-pay

## 2023-03-11 ENCOUNTER — Encounter (HOSPITAL_COMMUNITY): Payer: Self-pay | Admitting: Specialist

## 2023-03-11 DIAGNOSIS — M1711 Unilateral primary osteoarthritis, right knee: Secondary | ICD-10-CM | POA: Diagnosis not present

## 2023-03-11 MED ORDER — MAGNESIUM OXIDE -MG SUPPLEMENT 400 (240 MG) MG PO TABS
200.0000 mg | ORAL_TABLET | Freq: Every day | ORAL | Status: DC
  Administered 2023-03-11: 200 mg via ORAL
  Filled 2023-03-11: qty 1

## 2023-03-11 NOTE — Anesthesia Postprocedure Evaluation (Signed)
Anesthesia Post Note  Patient: Alexandria Middleton  Procedure(s) Performed: TOTAL KNEE ARTHROPLASTY (Right: Knee)     Patient location during evaluation: PACU Anesthesia Type: Regional, MAC and Spinal Level of consciousness: awake and alert Pain management: pain level controlled Vital Signs Assessment: post-procedure vital signs reviewed and stable Respiratory status: spontaneous breathing, nonlabored ventilation, respiratory function stable and patient connected to nasal cannula oxygen Cardiovascular status: stable and blood pressure returned to baseline Postop Assessment: no apparent nausea or vomiting Anesthetic complications: no   No notable events documented.  Last Vitals:  Vitals:   03/11/23 0830 03/11/23 0918  BP: 130/66 (!) 122/58  Pulse: 65 73  Resp: 17 18  Temp: 36.8 C 36.8 C  SpO2: 95% 100%    Last Pain:  Vitals:   03/11/23 1900  TempSrc:   PainSc: 5                  Djeneba Barsch P Kowen Kluth

## 2023-03-11 NOTE — Progress Notes (Signed)
Physical Therapy Treatment Patient Details Name: Alexandria Middleton MRN: [id number] DOB: Dec 31, 1960 Today's Date: 03/11/2023   History of Present Illness Pt is 62 yo female admitted 03/10/23 for R TKA.  Pt with hx including but not limited to arthritis, anxiety, GERD, HCL    PT Comments  POD # 1 pm session Spouse Alexandria Middleton present for [employer].  Assisted with amb to bathroom then a short distance in hallway.  Practiced stairs was difficult.  Pt has 7 steps to enter her home.  Required Max/Mod Assist and unable to safely navigate due to increased c/o fatigue and dizziness with exertion.  Assisted back to room and nback to bed per pt request. Pt did NOT meet her mobility goals to safely D/C today.  Reported to RN.   If plan is discharge home, recommend the following: A little help with walking and/or transfers;A little help with bathing/dressing/bathroom;Assistance with cooking/housework;Help with stairs or ramp for entrance   Can travel by private vehicle        Equipment Recommendations  Rolling walker (2 wheels)    Recommendations for Other Services       Precautions / Restrictions Precautions Precautions: Fall;Knee Precaution Comments: unable to perform SLR so used KI Restrictions Weight Bearing Restrictions: No RLE Weight Bearing: Weight bearing as tolerated     Mobility  Bed Mobility Overal bed mobility: Needs Assistance Bed Mobility: Sit to Supine     Supine to sit: Min assist     General bed mobility comments: assisted back to bed with Max c/o fatigue    Transfers Overall transfer level: Needs assistance Equipment used: Rolling walker (2 wheels) Transfers: Sit to/from Stand Sit to Stand: Supervision, Contact guard assist           General transfer comment: VC's on proper hand placement and safety with turns.  Also asissted with a toilet transfer.    Ambulation/Gait Ambulation/Gait assistance: Supervision, Contact guard assist Gait Distance (Feet): 28  Feet Assistive device: Rolling walker (2 wheels) Gait Pattern/deviations: Step-to pattern, Decreased stance time - right Gait velocity: decreased     General Gait Details: VC's on proper sequencing as well as proper walker to self distance.   Stairs Stairs: Yes Stairs assistance: Mod assist, Max assist Stair Management: Forwards, No rails Number of Stairs: 4 General stair comments: with Alexandria Middleton up/down multiple steps using walker due to NO rails.   Wheelchair Mobility     Tilt Bed    Modified Rankin (Stroke Patients Only)       Balance                                            Cognition Arousal: Alert Behavior During Therapy: WFL for tasks assessed/performed Overall Cognitive Status: Within Functional Limits for tasks assessed                                 General Comments: AxO x 3 pleasant and motivated.        Exercises      General Comments        Pertinent Vitals/Pain Pain Assessment Pain Assessment: Faces Pain Score: 7  Faces Pain Scale: Hurts little more Pain Location: R knee Pain Descriptors / Indicators: Operative site guarding, Constant Pain Intervention(s): Monitored during session, Repositioned, Premedicated before session, Ice applied  Home Living                          Prior Function            PT Goals (current goals can now be found in the care plan section) Progress towards PT goals: Progressing toward goals    Frequency           PT Plan      Co-evaluation              AM-PAC PT "6 Clicks" Mobility   Outcome Measure  Help needed turning from your back to your side while in a flat bed without using bedrails?: A Little Help needed moving from lying on your back to sitting on the side of a flat bed without using bedrails?: A Little Help needed moving to and from a bed to a chair (including a wheelchair)?: A Little Help needed standing up from a chair using your  arms (e.g., wheelchair or bedside chair)?: A Little Help needed to walk in hospital room?: A Little Help needed climbing 3-5 steps with a railing? : A Little 6 Click Score: 18    End of Session Equipment Utilized During Treatment: Gait belt Activity Tolerance: Patient tolerated treatment well;Patient limited by fatigue Patient left: with chair alarm set;in chair;with call bell/phone within reach Nurse Communication: Mobility status PT Visit Diagnosis: Other abnormalities of gait and mobility (R26.89);Muscle weakness (generalized) (M62.81)     Time: 1310-1340 PT Time Calculation (min) (ACUTE ONLY): 30 min  Charges:    $Gait Training: 8-22 mins $Therapeutic Activity: 8-22 mins PT General Charges $$ ACUTE PT VISIT: 1 Visit                     Felecia Shelling  PTA Acute  Rehabilitation Services Office M-F          [phone]

## 2023-03-11 NOTE — Progress Notes (Signed)
Subjective: 1 Day Post-Op Procedure(s) (LRB): TOTAL KNEE ARTHROPLASTY (Right) Patient reports pain as 4 on 0-10 scale.   Denies CP or SOB.  Voiding without difficulty. Positive flatus. Objective: Vital signs in last 24 hours: Temp:  [97.6 F (36.4 C)-98.4 F (36.9 C)] 98.4 F (36.9 C) (12/05 0547) Pulse Rate:  [59-75] 61 (12/05 0547) Resp:  [9-18] 16 (12/05 0547) BP: (102-130)/(55-72) 116/55 (12/05 0547) SpO2:  [93 %-97 %] 93 % (12/05 0547) Weight:  [77.6 kg] 77.6 kg (12/04 1416)  Intake/Output from previous day: 12/04 0701 - 12/05 0700 In: 2340.1 [P.O.:840; I.V.:1000; IV Piggyback:500.1] Out: 3500 [Urine:3450; Blood:50] Intake/Output this shift: No intake/output data recorded.  No results for input(s): "HGB" in the last 72 hours. No results for input(s): "WBC", "RBC", "HCT", "PLT" in the last 72 hours. No results for input(s): "NA", "K", "CL", "CO2", "BUN", "CREATININE", "GLUCOSE", "CALCIUM" in the last 72 hours. No results for input(s): "LABPT", "INR" in the last 72 hours.  Neurologically intact Neurovascular intact Sensation intact distally Dorsiflexion/Plantar flexion intact Incision: dressing C/D/I Compartment soft No DVT  Assessment/Plan:  1 Day Post-Op Procedure(s) (LRB): TOTAL KNEE ARTHROPLASTY (Right) Advance diet Discharge home with home health when passing PT   Principal Problem:   S/P total knee arthroplasty, right Active Problems:   Right knee DJD   Anticipated LOS equal to or greater than 2 midnights due to - Age 63 and older with one or more of the following:  - Obesity  - Expected need for hospital services (PT, OT, Nursing) required for safe  discharge  - Anticipated need for postoperative skilled nursing care or inpatient rehab  - Active co-morbidities: None OR   - Unanticipated findings during/Post Surgery: Slow post-op progression: GI, pain control, mobility  - Patient is a high risk of re-admission due to: None    Alexandria Middleton 03/11/2023, @[facility] 

## 2023-03-11 NOTE — Plan of Care (Signed)

## 2023-03-11 NOTE — TOC Transition Note (Signed)
Transition of Care [facility]) - CM/SW Discharge Note   Patient Details  Name: Alexandria Middleton MRN: [id number] Date of Birth: Jan 29, 1961  Transition of Care [facility]) CM/SW Contact:  Amada Jupiter, LCSW Phone Number: 03/11/2023, 9:52 AM   Clinical Narrative:     Met with pt who confirms need for RW and no DME agency preference - order placed with [employer] for delivery to room prior to dc.  OPPT already arranged with [facility].  No further TOC needs.  Final next level of care: OP Rehab Barriers to Discharge: No Barriers Identified   Patient Goals and CMS Choice      Discharge Placement                         Discharge Plan and Services Additional resources added to the After Visit Summary for                  DME Arranged: Walker rolling DME Agency: [employer] Date DME Agency Contacted: 03/11/23 Time DME Agency Contacted: [phone] Representative spoke with at DME Agency: Loraine Leriche            Social Determinants of Health (SDOH) Interventions SDOH Screenings   Food Insecurity: No Food Insecurity (03/10/2023)  Housing: Patient Declined (03/10/2023)  Transportation Needs: No Transportation Needs (03/10/2023)  Utilities: Not At Risk (03/10/2023)  Tobacco Use: Low Risk  (03/10/2023)     Readmission Risk Interventions    03/11/2023    9:51 AM  Readmission Risk Prevention Plan  Post Dischage Appt Complete  Medication Screening Complete  Transportation Screening Complete

## 2023-03-11 NOTE — Progress Notes (Signed)
Physical Therapy Treatment Patient Details Name: Alexandria Middleton MRN: [id number] DOB: 06/10/1960 Today's Date: 03/11/2023   History of Present Illness Pt is 62 yo female admitted 03/10/23 for R TKA.  Pt with hx including but not limited to arthritis, anxiety, GERD, HCL    PT Comments  POD # 1 am session Assisted OOB to [facility] then amb in hallway a limited distance.  VC's on proper sequencing as well as proper walker to self distance.  Then returned to room to perform some TE's following HEP handout.  Instructed on proper tech, freq as well as use of ICE.   Will see pt again this afternoon to practice stairs with wife.    If plan is discharge home, recommend the following: A little help with walking and/or transfers;A little help with bathing/dressing/bathroom;Assistance with cooking/housework;Help with stairs or ramp for entrance   Can travel by private vehicle        Equipment Recommendations  Rolling walker (2 wheels)    Recommendations for Other Services       Precautions / Restrictions Precautions Precautions: Fall;Knee Precaution Comments: unable to perform SLR so used KI Restrictions Weight Bearing Restrictions: No RLE Weight Bearing: Weight bearing as tolerated     Mobility  Bed Mobility Overal bed mobility: Needs Assistance Bed Mobility: Supine to Sit     Supine to sit: Min assist     General bed mobility comments: assist R LE off bed using belt    Transfers Overall transfer level: Needs assistance Equipment used: Rolling walker (2 wheels) Transfers: Sit to/from Stand Sit to Stand: Contact guard assist, Supervision           General transfer comment: VC's on proper hand placement and safety with turns.  Also asissted with a toilet transfer.    Ambulation/Gait Ambulation/Gait assistance: Supervision, Contact guard assist Gait Distance (Feet): 32 Feet Assistive device: Rolling walker (2 wheels) Gait Pattern/deviations: Step-to pattern, Decreased stance  time - right Gait velocity: decreased     General Gait Details: VC's on proper sequencing as well as proper walker to self distance.   Stairs             Wheelchair Mobility     Tilt Bed    Modified Rankin (Stroke Patients Only)       Balance                                            Cognition Arousal: Alert Behavior During Therapy: WFL for tasks assessed/performed Overall Cognitive Status: Within Functional Limits for tasks assessed                                 General Comments: AxO x 3 pleasant and motivated.        Exercises  Total Knee Replacement TE's following HEP handout 10 reps B LE ankle pumps 05 reps towel squeezes 05 reps knee presses 05 reps heel slides  05 reps SAQ's 05 reps SLR's 05 reps ABD Educated on use of gait belt to assist with TE's Followed by ICE      General Comments        Pertinent Vitals/Pain Pain Assessment Pain Assessment: 0-10 Pain Score: 5  Pain Location: R knee Pain Descriptors / Indicators: Operative site guarding, Constant Pain Intervention(s): Monitored during session, Premedicated before session,  Repositioned, Ice applied    Home Living                          Prior Function            PT Goals (current goals can now be found in the care plan section) Progress towards PT goals: Progressing toward goals    Frequency           PT Plan      Co-evaluation              AM-PAC PT "6 Clicks" Mobility   Outcome Measure  Help needed turning from your back to your side while in a flat bed without using bedrails?: A Little Help needed moving from lying on your back to sitting on the side of a flat bed without using bedrails?: A Little Help needed moving to and from a bed to a chair (including a wheelchair)?: A Little Help needed standing up from a chair using your arms (e.g., wheelchair or bedside chair)?: A Little Help needed to walk in hospital  room?: A Little Help needed climbing 3-5 steps with a railing? : A Lot 6 Click Score: 17    End of Session Equipment Utilized During Treatment: Gait belt Activity Tolerance: Patient tolerated treatment well;Patient limited by fatigue Patient left: with chair alarm set;in chair;with call bell/phone within reach Nurse Communication: Mobility status PT Visit Diagnosis: Other abnormalities of gait and mobility (R26.89);Muscle weakness (generalized) (M62.81)     Time: 1040-1108 PT Time Calculation (min) (ACUTE ONLY): 28 min  Charges:    $Gait Training: 8-22 mins $Therapeutic Activity: 8-22 mins PT General Charges $$ ACUTE PT VISIT: 1 Visit                     {Erykah Lippert  PTA Acute  Rehabilitation Services Office M-F          [phone]

## 2023-03-11 NOTE — Plan of Care (Signed)
  Problem: Education: Goal: Knowledge of General Education information will improve Description: Including pain rating scale, medication(s)/side effects and non-pharmacologic comfort measures Outcome: Adequate for Discharge   Problem: Health Behavior/Discharge Planning: Goal: Ability to manage health-related needs will improve Outcome: Adequate for Discharge   Problem: Clinical Measurements: Goal: Ability to maintain clinical measurements within normal limits will improve Outcome: Progressing Goal: Will remain free from infection Outcome: Progressing Goal: Diagnostic test results will improve Outcome: Progressing Goal: Respiratory complications will improve Outcome: Progressing Goal: Cardiovascular complication will be avoided Outcome: Progressing   Problem: Activity: Goal: Risk for activity intolerance will decrease Outcome: Adequate for Discharge   Problem: Nutrition: Goal: Adequate nutrition will be maintained Outcome: Completed/Met   Problem: Coping: Goal: Level of anxiety will decrease Outcome: Progressing   Problem: Elimination: Goal: Will not experience complications related to bowel motility Outcome: Progressing Goal: Will not experience complications related to urinary retention Outcome: Progressing   Problem: Pain Management: Goal: General experience of comfort will improve Outcome: Progressing   Problem: Safety: Goal: Ability to remain free from injury will improve Outcome: Progressing   Problem: Skin Integrity: Goal: Risk for impaired skin integrity will decrease Outcome: Adequate for Discharge   Problem: Education: Goal: Knowledge of the prescribed therapeutic regimen will improve Outcome: Progressing Goal: Individualized Educational Video(s) Outcome: Completed/Met   Problem: Activity: Goal: Ability to avoid complications of mobility impairment will improve Outcome: Progressing Goal: Range of joint motion will improve Outcome: Adequate for  Discharge   Problem: Clinical Measurements: Goal: Postoperative complications will be avoided or minimized Outcome: Progressing   Problem: Pain Management: Goal: Pain level will decrease with appropriate interventions Outcome: Progressing   Problem: Skin Integrity: Goal: Will show signs of wound healing Outcome: Progressing

## 2023-03-12 ENCOUNTER — Other Ambulatory Visit (HOSPITAL_COMMUNITY): Payer: Self-pay

## 2023-03-12 DIAGNOSIS — M1711 Unilateral primary osteoarthritis, right knee: Secondary | ICD-10-CM | POA: Diagnosis not present

## 2023-03-12 MED ORDER — HYDROMORPHONE HCL 2 MG PO TABS
2.0000 mg | ORAL_TABLET | ORAL | Status: DC | PRN
  Administered 2023-03-12: 2 mg via ORAL
  Filled 2023-03-12 (×2): qty 1

## 2023-03-12 MED ORDER — HYDROMORPHONE HCL 2 MG PO TABS
2.0000 mg | ORAL_TABLET | ORAL | 0 refills | Status: AC | PRN
  Filled 2023-03-12: qty 40, 3d supply, fill #0

## 2023-03-12 NOTE — Discharge Summary (Signed)
Physician Discharge Summary   Patient ID: Alexandria Middleton MRN: [id number] DOB/AGE: 11/28/1960 62 y.o.  Admit date: 03/10/2023 Discharge date: 03/12/23  Primary Diagnosis: right knee primary osteoarthritis  Admission Diagnoses:  Past Medical History:  Diagnosis Date   Allergies    Anxiety    Arthritis    Depression    Family history of adverse reaction to anesthesia    mother PONV   GERD (gastroesophageal reflux disease)    Hypercholesteremia    PONV (postoperative nausea and vomiting)    Discharge Diagnoses:   Principal Problem:   S/P total knee arthroplasty, right Active Problems:   Right knee DJD  Estimated body mass index is 29.35 kg/m as calculated from the following:   Height as of this encounter: 5' 4.02" (1.626 m).   Weight as of this encounter: 77.6 kg.  Procedure:  Procedure(s) (LRB): TOTAL KNEE ARTHROPLASTY (Right)   Consults: None  HPI: see H&P Laboratory Data: Hospital Outpatient Visit on 03/03/2023  Component Date Value Ref Range Status   MRSA, PCR 03/03/2023 NEGATIVE  NEGATIVE Final   Staphylococcus aureus 03/03/2023 NEGATIVE  NEGATIVE Final   Comment: (NOTE) The Xpert SA Assay (FDA approved for NASAL specimens in patients 64 years of age and older), is one component of a comprehensive surveillance program. It is not intended to diagnose infection nor to guide or monitor treatment. Performed at [facility], [address] [place], [place] [address]    WBC 03/03/2023 4.2  4.0 - 10.5 K/uL Final   RBC 03/03/2023 4.76  3.87 - 5.11 MIL/uL Final   Hemoglobin 03/03/2023 13.7  12.0 - 15.0 g/dL Final   HCT 19/14/7829 42.1  36.0 - 46.0 % Final   MCV 03/03/2023 88.4  80.0 - 100.0 fL Final   MCH 03/03/2023 28.8  26.0 - 34.0 pg Final   MCHC 03/03/2023 32.5  30.0 - 36.0 g/dL Final   RDW 56/21/3086 13.0  11.5 - 15.5 % Final   Platelets 03/03/2023 207  150 - 400 K/uL Final   nRBC 03/03/2023 0.0  0.0 - 0.2 % Final   Performed at [facility], [address], [place], [place] [address]     X-Rays:DG Knee 1-2 Views Right  Result Date: 03/10/2023 CLINICAL DATA:  Total knee replacement EXAM: RIGHT KNEE - 1-2 VIEW COMPARISON:  04/17/2022 FINDINGS: Right total knee arthroplasty changes noted. Components are aligned. No acute abnormality or osseous finding by plain radiography. IMPRESSION: Right total knee arthroplasty.  Expected postoperative findings. Electronically Signed   By: Judie Petit.  Shick M.D.   On: 03/10/2023 12:08    EKG:No orders found for this or any previous visit.   Hospital Course: Alexandria Middleton is a 62 y.o. who was admitted to [facility]. They were brought to the operating room on 03/10/2023 and underwent Procedure(s): TOTAL KNEE ARTHROPLASTY.  Patient tolerated the procedure well and was later transferred to the recovery room and then to the orthopaedic floor for postoperative care.  They were given PO and IV analgesics for pain control following their surgery.  They were given 24 hours of postoperative antibiotics of  Anti-infectives (From admission, onward)    Start     Dose/Rate Route Frequency Ordered Stop   03/10/23 1500  ceFAZolin (ANCEF) IVPB 2g/100 mL premix        2 g 200 mL/hr over 30 Minutes Intravenous Every 6 hours 03/10/23 1302 03/10/23 2200   03/10/23 1400  azithromycin (ZITHROMAX) tablet 250 mg  Status:  Discontinued       Note to Pharmacy: Take first 2 tablets together, then 1 every day until finished.     250 mg Oral Daily 03/10/23 1302 03/10/23 1311   03/10/23 0645  ceFAZolin (ANCEF) IVPB 2g/100 mL premix        2 g 200 mL/hr over 30 Minutes Intravenous On call to O.R. 03/10/23 [id number] 03/10/23 0931      and started on DVT prophylaxis in the form of Aspirin, TED hose, and SCDs .   PT and OT were ordered for total joint protocol.  Discharge planning consulted to help with postop disposition and equipment needs.  Patient had a fair night on the evening of surgery.  They started  to get up OOB with therapy on day one. Continued to work with therapy into day two.  By day two the patient had progressed with therapy and meeting their goals.  Incision was healing well.  Patient was seen in rounds and was ready to go home.   Diet: Regular diet Activity:WBAT Follow-up:in 10-14 days Disposition - Home Discharged Condition: good   Discharge Instructions     Call MD / Call 911   Complete by: As directed    If you experience chest pain or shortness of breath, CALL 911 and be transported to the hospital emergency room.  If you develope a fever above 101 F, pus (white drainage) or increased drainage or redness at the wound, or calf pain, call your surgeon's office.   Constipation Prevention   Complete by: As directed    Drink plenty of fluids.  Prune juice may be helpful.  You may use a stool softener, such as Colace (over the counter) 100 mg twice a day.  Use MiraLax (over the counter) for constipation as needed.   Diet - low sodium heart healthy   Complete by: As directed    Increase activity slowly as tolerated   Complete by: As directed    Post-operative opioid taper instructions:   Complete by: As directed    POST-OPERATIVE OPIOID TAPER INSTRUCTIONS: It is important to wean off of your opioid medication as soon as possible. If you do not need pain medication after your surgery it is ok to stop day one. Opioids include: Codeine, Hydrocodone(Norco, Vicodin), Oxycodone(Percocet, oxycontin) and hydromorphone amongst others.  Long term and even short term use of opiods can cause: Increased pain response Dependence Constipation Depression Respiratory depression And more.  Withdrawal symptoms can include Flu like symptoms Nausea, vomiting And more Techniques to manage these symptoms Hydrate well Eat regular healthy meals Stay active Use relaxation techniques(deep breathing, meditating, yoga) Do Not substitute Alcohol to help with tapering If you have been on  opioids for less than two weeks and do not have pain than it is ok to stop all together.  Plan to wean off of opioids This plan should start within one week post op of your joint replacement. Maintain the same interval or time between taking each dose and first decrease the dose.  Cut the total daily intake of opioids by one tablet each day Next start to increase the time between doses. The last dose that should be eliminated is the evening dose.         Allergies as of 03/12/2023       Reactions   Codeine    GI Intolerance        Medication List     TAKE these medications    acetaminophen 500 MG tablet  Commonly known as: TYLENOL Take 1,000 mg by mouth every 6 (six) hours as needed for moderate pain (pain score 4-6).   AIRBORNE PO Take 1 tablet by mouth daily.   aspirin EC 81 MG tablet Take 1 tablet (81 mg total) by mouth 2 (two) times daily after a meal. Start the day after surgery.   B-12 PO Take 1 tablet by mouth daily.   diphenhydrAMINE 25 MG tablet Commonly known as: SOMINEX Take 25 mg by mouth at bedtime as needed for sleep.   docusate sodium 100 MG capsule Commonly known as: Colace Take 1 capsule (100 mg total) by mouth 2 (two) times daily.   HYDROmorphone 2 MG tablet Commonly known as: DILAUDID Take 1-2 tablets (2-4 mg total) by mouth every 3 (three) hours as needed for severe pain (pain score 7-10).   MAGNESIUM PO Take 1 tablet by mouth daily.   meloxicam 15 MG tablet Commonly known as: MOBIC Take 15 mg by mouth daily as needed for pain.   methocarbamol 500 MG tablet Commonly known as: ROBAXIN Take 1 tablet (500 mg total) by mouth every 8 (eight) hours as needed for muscle spasms.   multivitamin with minerals Tabs tablet Take 1 tablet by mouth daily.   omeprazole 20 MG capsule Commonly known as: PRILOSEC Take 20 mg by mouth daily.   polyethylene glycol powder 17 GM/SCOOP powder Commonly known as: GLYCOLAX/MIRALAX Mix 17 g in 4 oz of water  or juice and take by mouth once daily.   sertraline 100 MG tablet Commonly known as: ZOLOFT Take 200 mg by mouth daily.   Vitamin D3 50 MCG (2000 UT) Tabs Take 2,000 Units by mouth daily.   Voltaren Arthritis Pain 1 % Gel Generic drug: diclofenac Sodium Apply 2 g topically in the morning and at bedtime.         Signed: Andrez Grime, PA-C Orthopaedic Surgery 03/12/2023, 3:58 PM

## 2023-03-12 NOTE — Plan of Care (Signed)
  Problem: Education: Goal: Knowledge of General Education information will improve Description: Including pain rating scale, medication(s)/side effects and non-pharmacologic comfort measures Outcome: Adequate for Discharge   Problem: Health Behavior/Discharge Planning: Goal: Ability to manage health-related needs will improve Outcome: Progressing   Problem: Clinical Measurements: Goal: Ability to maintain clinical measurements within normal limits will improve Outcome: Progressing Goal: Will remain free from infection Outcome: Progressing Goal: Diagnostic test results will improve Outcome: Progressing Goal: Respiratory complications will improve Outcome: Progressing Goal: Cardiovascular complication will be avoided Outcome: Progressing   Problem: Activity: Goal: Risk for activity intolerance will decrease Outcome: Adequate for Discharge   Problem: Coping: Goal: Level of anxiety will decrease Outcome: Progressing   Problem: Elimination: Goal: Will not experience complications related to bowel motility Outcome: Progressing Goal: Will not experience complications related to urinary retention Outcome: Completed/Met   Problem: Pain Management: Goal: General experience of comfort will improve Outcome: Progressing   Problem: Safety: Goal: Ability to remain free from injury will improve Outcome: Progressing   Problem: Skin Integrity: Goal: Risk for impaired skin integrity will decrease Outcome: Adequate for Discharge   Problem: Education: Goal: Knowledge of the prescribed therapeutic regimen will improve Outcome: Progressing   Problem: Activity: Goal: Ability to avoid complications of mobility impairment will improve Outcome: Adequate for Discharge Goal: Range of joint motion will improve Outcome: Adequate for Discharge   Problem: Clinical Measurements: Goal: Postoperative complications will be avoided or minimized Outcome: Progressing   Problem: Pain  Management: Goal: Pain level will decrease with appropriate interventions Outcome: Adequate for Discharge   Problem: Skin Integrity: Goal: Will show signs of wound healing Outcome: Progressing

## 2023-03-12 NOTE — Progress Notes (Signed)
Subjective: 2 Days Post-Op Procedure(s) (LRB): TOTAL KNEE ARTHROPLASTY (Right) Patient reports pain as moderate and severe.  ] IV dilaudid overnight seemed to help and allow rest Has not been OOB yet today, did note dizziness yesterday  Objective: Vital signs in last 24 hours: Temp:  [98 F (36.7 C)-99.1 F (37.3 C)] 99.1 F (37.3 C) (12/06 0559) Pulse Rate:  [65-76] 76 (12/06 0559) Resp:  [17-18] 18 (12/06 0559) BP: (122-147)/(58-74) 141/69 (12/06 0559) SpO2:  [93 %-100 %] 96 % (12/06 0559)  Intake/Output from previous day: 12/05 0701 - 12/06 0700 In: 720 [P.O.:720] Out: -  Intake/Output this shift: No intake/output data recorded.  No results for input(s): "HGB" in the last 72 hours. No results for input(s): "WBC", "RBC", "HCT", "PLT" in the last 72 hours. No results for input(s): "NA", "K", "CL", "CO2", "BUN", "CREATININE", "GLUCOSE", "CALCIUM" in the last 72 hours. No results for input(s): "LABPT", "INR" in the last 72 hours.  Neurologically intact ABD soft Neurovascular intact Sensation intact distally Intact pulses distally Dorsiflexion/Plantar flexion intact Incision: dressing C/D/I and no drainage No cellulitis present Compartment soft No calf pain or sign of DVT   Assessment/Plan: 2 Days Post-Op Procedure(s) (LRB): TOTAL KNEE ARTHROPLASTY (Right) Advance diet Up with therapy D/C IV fluids Switch from oxy to PO dilaudid for better pain control Possible D/C later today if passes PT and pain better controlled Discussed with Dr Tenna Delaine Doralee Albino 03/12/2023, 8:22 AM

## 2023-03-12 NOTE — Progress Notes (Signed)
Physical Therapy Treatment Patient Details Name: Alexandria Middleton MRN: [id number] DOB: May 23, 1960 Today's Date: 03/12/2023   History of Present Illness Pt is 62 yo female admitted 03/10/23 for R TKA.  Pt with hx including but not limited to arthritis, anxiety, GERD, HCL    PT Comments  POD # 2 am session Pt was in bathroom.  General transfer comment: VC's on proper hand placement and safety with turns.  Also asissted with a toilet transfer. General Gait Details: decreased amb distance out of bathrrom due to stair training as main focus session.General stair comments: with Wynona Canes Pt's Wife up/down 7 steps using walker due to NO rails. Pt was able to safe navigate and has met her mobility goals to D/C to home today with Wife support.    If plan is discharge home, recommend the following: A little help with walking and/or transfers;A little help with bathing/dressing/bathroom;Assistance with cooking/housework;Help with stairs or ramp for entrance   Can travel by private vehicle        Equipment Recommendations       Recommendations for Other Services       Precautions / Restrictions Precautions Precaution Comments: KI for stairs Restrictions Weight Bearing Restrictions: No RLE Weight Bearing: Weight bearing as tolerated     Mobility  Bed Mobility               General bed mobility comments: OOB in bathroom    Transfers Overall transfer level: Needs assistance Equipment used: Rolling walker (2 wheels) Transfers: Sit to/from Stand Sit to Stand: Supervision           General transfer comment: VC's on proper hand placement and safety with turns.  Also asissted with a toilet transfer.    Ambulation/Gait Ambulation/Gait assistance: Supervision Gait Distance (Feet): 6 Feet Assistive device: Rolling walker (2 wheels) Gait Pattern/deviations: Step-to pattern, Decreased stance time - right Gait velocity: decreased     General Gait Details: decreased amb distance out  of bathrrom due to stair training as main focus session.   Stairs Stairs: Yes Stairs assistance: Min assist, Mod assist Stair Management: Forwards, No rails Number of Stairs: 7 General stair comments: with Wynona Canes Pt's Wife up/down 7 steps using walker due to NO rails.   Wheelchair Mobility     Tilt Bed    Modified Rankin (Stroke Patients Only)       Balance                                            Cognition Arousal: Alert Behavior During Therapy: WFL for tasks assessed/performed Overall Cognitive Status: Within Functional Limits for tasks assessed                                 General Comments: AxO x 3 pleasant and motivated. Fatigues easily        Exercises      General Comments        Pertinent Vitals/Pain Pain Assessment Pain Assessment: 0-10 Pain Score: 7  Pain Location: R knee Pain Descriptors / Indicators: Operative site guarding, Constant Pain Intervention(s): Monitored during session, Premedicated before session, Repositioned, Ice applied    Home Living                          Prior  Function            PT Goals (current goals can now be found in the care plan section) Progress towards PT goals: Progressing toward goals    Frequency    7X/week      PT Plan      Co-evaluation              AM-PAC PT "6 Clicks" Mobility   Outcome Measure  Help needed turning from your back to your side while in a flat bed without using bedrails?: A Little Help needed moving from lying on your back to sitting on the side of a flat bed without using bedrails?: A Little Help needed moving to and from a bed to a chair (including a wheelchair)?: A Little Help needed standing up from a chair using your arms (e.g., wheelchair or bedside chair)?: A Little Help needed to walk in hospital room?: A Little Help needed climbing 3-5 steps with a railing? : A Little 6 Click Score: 18    End of Session  Equipment Utilized During Treatment: Gait belt Activity Tolerance: Patient tolerated treatment well;Patient limited by fatigue Patient left: with chair alarm set;in chair;with call bell/phone within reach Nurse Communication: Mobility status PT Visit Diagnosis: Other abnormalities of gait and mobility (R26.89);Muscle weakness (generalized) (M62.81)     Time: 8119-1478 PT Time Calculation (min) (ACUTE ONLY): 25 min  Charges:    $Gait Training: 8-22 mins $Therapeutic Activity: 8-22 mins PT General Charges $$ ACUTE PT VISIT: 1 Visit                     Felecia Shelling  PTA Acute  Rehabilitation Services Office M-F          [phone]

## 2023-04-15 DIAGNOSIS — M25661 Stiffness of right knee, not elsewhere classified: Secondary | ICD-10-CM | POA: Diagnosis not present

## 2023-04-15 DIAGNOSIS — M25561 Pain in right knee: Secondary | ICD-10-CM | POA: Diagnosis not present

## 2023-04-21 DIAGNOSIS — Z0000 Encounter for general adult medical examination without abnormal findings: Secondary | ICD-10-CM | POA: Diagnosis not present

## 2023-04-21 DIAGNOSIS — E6609 Other obesity due to excess calories: Secondary | ICD-10-CM | POA: Diagnosis not present

## 2023-04-21 DIAGNOSIS — M25661 Stiffness of right knee, not elsewhere classified: Secondary | ICD-10-CM | POA: Diagnosis not present

## 2023-04-21 DIAGNOSIS — E559 Vitamin D deficiency, unspecified: Secondary | ICD-10-CM | POA: Diagnosis not present

## 2023-04-21 DIAGNOSIS — F339 Major depressive disorder, recurrent, unspecified: Secondary | ICD-10-CM | POA: Diagnosis not present

## 2023-04-21 DIAGNOSIS — M25561 Pain in right knee: Secondary | ICD-10-CM | POA: Diagnosis not present

## 2023-04-21 DIAGNOSIS — E7800 Pure hypercholesterolemia, unspecified: Secondary | ICD-10-CM | POA: Diagnosis not present

## 2023-04-23 DIAGNOSIS — M25561 Pain in right knee: Secondary | ICD-10-CM | POA: Diagnosis not present

## 2023-04-23 DIAGNOSIS — M25661 Stiffness of right knee, not elsewhere classified: Secondary | ICD-10-CM | POA: Diagnosis not present

## 2023-04-28 DIAGNOSIS — M25661 Stiffness of right knee, not elsewhere classified: Secondary | ICD-10-CM | POA: Diagnosis not present

## 2023-04-28 DIAGNOSIS — M25561 Pain in right knee: Secondary | ICD-10-CM | POA: Diagnosis not present

## 2023-04-30 DIAGNOSIS — M25561 Pain in right knee: Secondary | ICD-10-CM | POA: Diagnosis not present

## 2023-04-30 DIAGNOSIS — M25661 Stiffness of right knee, not elsewhere classified: Secondary | ICD-10-CM | POA: Diagnosis not present

## 2023-05-05 DIAGNOSIS — M25561 Pain in right knee: Secondary | ICD-10-CM | POA: Diagnosis not present

## 2023-05-05 DIAGNOSIS — M25661 Stiffness of right knee, not elsewhere classified: Secondary | ICD-10-CM | POA: Diagnosis not present

## 2023-05-07 DIAGNOSIS — M25561 Pain in right knee: Secondary | ICD-10-CM | POA: Diagnosis not present

## 2023-05-07 DIAGNOSIS — M25661 Stiffness of right knee, not elsewhere classified: Secondary | ICD-10-CM | POA: Diagnosis not present

## 2023-05-12 DIAGNOSIS — M25561 Pain in right knee: Secondary | ICD-10-CM | POA: Diagnosis not present

## 2023-05-12 DIAGNOSIS — G4719 Other hypersomnia: Secondary | ICD-10-CM | POA: Diagnosis not present

## 2023-05-12 DIAGNOSIS — M25661 Stiffness of right knee, not elsewhere classified: Secondary | ICD-10-CM | POA: Diagnosis not present

## 2023-05-14 DIAGNOSIS — M25561 Pain in right knee: Secondary | ICD-10-CM | POA: Diagnosis not present

## 2023-05-14 DIAGNOSIS — M25661 Stiffness of right knee, not elsewhere classified: Secondary | ICD-10-CM | POA: Diagnosis not present

## 2023-05-18 ENCOUNTER — Other Ambulatory Visit: Payer: Self-pay | Admitting: Internal Medicine

## 2023-05-18 DIAGNOSIS — R911 Solitary pulmonary nodule: Secondary | ICD-10-CM

## 2023-05-19 DIAGNOSIS — M25661 Stiffness of right knee, not elsewhere classified: Secondary | ICD-10-CM | POA: Diagnosis not present

## 2023-05-19 DIAGNOSIS — M25561 Pain in right knee: Secondary | ICD-10-CM | POA: Diagnosis not present

## 2023-05-21 DIAGNOSIS — M25561 Pain in right knee: Secondary | ICD-10-CM | POA: Diagnosis not present

## 2023-05-21 DIAGNOSIS — M25661 Stiffness of right knee, not elsewhere classified: Secondary | ICD-10-CM | POA: Diagnosis not present

## 2023-05-24 DIAGNOSIS — M545 Low back pain, unspecified: Secondary | ICD-10-CM | POA: Diagnosis not present

## 2023-06-07 DIAGNOSIS — G4733 Obstructive sleep apnea (adult) (pediatric): Secondary | ICD-10-CM | POA: Diagnosis not present

## 2023-06-16 ENCOUNTER — Other Ambulatory Visit: Payer: 59

## 2023-06-17 DIAGNOSIS — G4733 Obstructive sleep apnea (adult) (pediatric): Secondary | ICD-10-CM | POA: Diagnosis not present

## 2023-07-05 ENCOUNTER — Ambulatory Visit
Admission: RE | Admit: 2023-07-05 | Discharge: 2023-07-05 | Disposition: A | Discharge status: Home / Self Care | Source: Ambulatory Visit | Attending: Internal Medicine | Admitting: Internal Medicine

## 2023-07-05 DIAGNOSIS — R911 Solitary pulmonary nodule: Secondary | ICD-10-CM

## 2023-07-23 DIAGNOSIS — G4733 Obstructive sleep apnea (adult) (pediatric): Secondary | ICD-10-CM | POA: Diagnosis not present

## 2023-08-22 DIAGNOSIS — G4733 Obstructive sleep apnea (adult) (pediatric): Secondary | ICD-10-CM | POA: Diagnosis not present

## 2023-09-22 DIAGNOSIS — G4733 Obstructive sleep apnea (adult) (pediatric): Secondary | ICD-10-CM | POA: Diagnosis not present

## 2023-10-22 DIAGNOSIS — G4733 Obstructive sleep apnea (adult) (pediatric): Secondary | ICD-10-CM | POA: Diagnosis not present

## 2023-11-22 DIAGNOSIS — G4733 Obstructive sleep apnea (adult) (pediatric): Secondary | ICD-10-CM | POA: Diagnosis not present

## 2023-11-23 DIAGNOSIS — G4733 Obstructive sleep apnea (adult) (pediatric): Secondary | ICD-10-CM | POA: Diagnosis not present

## 2023-11-30 DIAGNOSIS — G4733 Obstructive sleep apnea (adult) (pediatric): Secondary | ICD-10-CM | POA: Diagnosis not present

## 2023-12-23 DIAGNOSIS — G4733 Obstructive sleep apnea (adult) (pediatric): Secondary | ICD-10-CM | POA: Diagnosis not present

## 2024-01-12 DIAGNOSIS — G4733 Obstructive sleep apnea (adult) (pediatric): Secondary | ICD-10-CM | POA: Diagnosis not present

## 2024-01-12 DIAGNOSIS — G4734 Idiopathic sleep related nonobstructive alveolar hypoventilation: Secondary | ICD-10-CM | POA: Diagnosis not present

## 2024-02-16 DIAGNOSIS — G4733 Obstructive sleep apnea (adult) (pediatric): Secondary | ICD-10-CM | POA: Diagnosis not present

## 2024-02-22 DIAGNOSIS — G4733 Obstructive sleep apnea (adult) (pediatric): Secondary | ICD-10-CM | POA: Diagnosis not present
